# Patient Record
Sex: Female | Born: 1940 | Race: Black or African American | Hispanic: No | Smoking: Former smoker
Health system: Southern US, Community
[De-identification: ages and names within clinical notes are randomized; demographics above are authoritative.]

## PROBLEM LIST (undated history)

## (undated) DIAGNOSIS — C801 Malignant (primary) neoplasm, unspecified: Secondary | ICD-10-CM

## (undated) DIAGNOSIS — M549 Dorsalgia, unspecified: Secondary | ICD-10-CM

## (undated) DIAGNOSIS — I10 Essential (primary) hypertension: Secondary | ICD-10-CM

## (undated) HISTORY — PX: BACK SURGERY: SHX140

## (undated) HISTORY — PX: BREAST SURGERY: SHX581

## (undated) HISTORY — PX: MASTECTOMY: SHX3

---

## 1997-07-19 ENCOUNTER — Ambulatory Visit (HOSPITAL_COMMUNITY): Admission: RE | Admit: 1997-07-19 | Discharge: 1997-07-19 | Payer: Self-pay | Admitting: Family Medicine

## 1997-08-18 ENCOUNTER — Other Ambulatory Visit: Admission: RE | Admit: 1997-08-18 | Discharge: 1997-08-18 | Payer: Self-pay | Admitting: Nephrology

## 1997-08-24 ENCOUNTER — Ambulatory Visit (HOSPITAL_COMMUNITY): Admission: RE | Admit: 1997-08-24 | Discharge: 1997-08-24 | Payer: Self-pay | Admitting: Nephrology

## 1997-09-29 ENCOUNTER — Other Ambulatory Visit: Admission: RE | Admit: 1997-09-29 | Discharge: 1997-09-29 | Payer: Self-pay | Admitting: Nephrology

## 1997-10-20 ENCOUNTER — Ambulatory Visit (HOSPITAL_BASED_OUTPATIENT_CLINIC_OR_DEPARTMENT_OTHER): Admission: RE | Admit: 1997-10-20 | Discharge: 1997-10-20 | Payer: Self-pay | Admitting: Otolaryngology

## 1997-11-14 ENCOUNTER — Ambulatory Visit (HOSPITAL_COMMUNITY): Admission: RE | Admit: 1997-11-14 | Discharge: 1997-11-14 | Payer: Self-pay | Admitting: General Surgery

## 1997-12-07 ENCOUNTER — Ambulatory Visit (HOSPITAL_COMMUNITY): Admission: RE | Admit: 1997-12-07 | Discharge: 1997-12-08 | Payer: Self-pay | Admitting: General Surgery

## 1997-12-07 ENCOUNTER — Encounter (HOSPITAL_BASED_OUTPATIENT_CLINIC_OR_DEPARTMENT_OTHER): Payer: Self-pay | Admitting: General Surgery

## 1998-01-02 ENCOUNTER — Encounter: Admission: RE | Admit: 1998-01-02 | Discharge: 1998-04-02 | Payer: Self-pay | Admitting: Nephrology

## 1998-08-25 ENCOUNTER — Encounter: Admission: RE | Admit: 1998-08-25 | Discharge: 1998-11-23 | Payer: Self-pay | Admitting: Nephrology

## 2016-09-09 ENCOUNTER — Emergency Department: Payer: Medicare Other

## 2016-09-09 ENCOUNTER — Inpatient Hospital Stay
Admission: EM | Admit: 2016-09-09 | Discharge: 2016-09-11 | DRG: 918 | Disposition: A | Discharge status: Home / Self Care | Payer: Medicare Other | Attending: Internal Medicine | Admitting: Internal Medicine

## 2016-09-09 DIAGNOSIS — R4589 Other symptoms and signs involving emotional state: Secondary | ICD-10-CM

## 2016-09-09 DIAGNOSIS — Z87891 Personal history of nicotine dependence: Secondary | ICD-10-CM

## 2016-09-09 DIAGNOSIS — N289 Disorder of kidney and ureter, unspecified: Secondary | ICD-10-CM | POA: Diagnosis present

## 2016-09-09 DIAGNOSIS — T40605A Adverse effect of unspecified narcotics, initial encounter: Secondary | ICD-10-CM

## 2016-09-09 DIAGNOSIS — L8990 Pressure ulcer of unspecified site, unspecified stage: Secondary | ICD-10-CM | POA: Insufficient documentation

## 2016-09-09 DIAGNOSIS — F329 Major depressive disorder, single episode, unspecified: Secondary | ICD-10-CM | POA: Diagnosis not present

## 2016-09-09 DIAGNOSIS — M545 Low back pain: Secondary | ICD-10-CM | POA: Diagnosis not present

## 2016-09-09 DIAGNOSIS — D638 Anemia in other chronic diseases classified elsewhere: Secondary | ICD-10-CM

## 2016-09-09 DIAGNOSIS — N184 Chronic kidney disease, stage 4 (severe): Secondary | ICD-10-CM | POA: Diagnosis not present

## 2016-09-09 DIAGNOSIS — G894 Chronic pain syndrome: Secondary | ICD-10-CM | POA: Diagnosis present

## 2016-09-09 DIAGNOSIS — R531 Weakness: Secondary | ICD-10-CM

## 2016-09-09 DIAGNOSIS — Z853 Personal history of malignant neoplasm of breast: Secondary | ICD-10-CM | POA: Diagnosis not present

## 2016-09-09 DIAGNOSIS — N19 Unspecified kidney failure: Secondary | ICD-10-CM

## 2016-09-09 DIAGNOSIS — D631 Anemia in chronic kidney disease: Secondary | ICD-10-CM | POA: Diagnosis not present

## 2016-09-09 DIAGNOSIS — T40601A Poisoning by unspecified narcotics, accidental (unintentional), initial encounter: Secondary | ICD-10-CM | POA: Diagnosis present

## 2016-09-09 DIAGNOSIS — T402X1A Poisoning by other opioids, accidental (unintentional), initial encounter: Secondary | ICD-10-CM | POA: Diagnosis not present

## 2016-09-09 DIAGNOSIS — R609 Edema, unspecified: Secondary | ICD-10-CM

## 2016-09-09 DIAGNOSIS — R739 Hyperglycemia, unspecified: Secondary | ICD-10-CM | POA: Diagnosis not present

## 2016-09-09 DIAGNOSIS — R0689 Other abnormalities of breathing: Secondary | ICD-10-CM

## 2016-09-09 DIAGNOSIS — I10 Essential (primary) hypertension: Secondary | ICD-10-CM | POA: Diagnosis present

## 2016-09-09 DIAGNOSIS — R4189 Other symptoms and signs involving cognitive functions and awareness: Secondary | ICD-10-CM

## 2016-09-09 DIAGNOSIS — N189 Chronic kidney disease, unspecified: Secondary | ICD-10-CM

## 2016-09-09 HISTORY — DX: Malignant (primary) neoplasm, unspecified: C80.1

## 2016-09-09 HISTORY — DX: Dorsalgia, unspecified: M54.9

## 2016-09-09 HISTORY — DX: Essential (primary) hypertension: I10

## 2016-09-09 LAB — URINE DRUG SCREEN, QUALITATIVE (ARMC ONLY)
AMPHETAMINES, UR SCREEN: NOT DETECTED
BENZODIAZEPINE, UR SCRN: NOT DETECTED
Barbiturates, Ur Screen: NOT DETECTED
CANNABINOID 50 NG, UR ~~LOC~~: NOT DETECTED
Cocaine Metabolite,Ur ~~LOC~~: NOT DETECTED
MDMA (ECSTASY) UR SCREEN: NOT DETECTED
Methadone Scn, Ur: NOT DETECTED
Opiate, Ur Screen: POSITIVE — AB
PHENCYCLIDINE (PCP) UR S: NOT DETECTED
TRICYCLIC, UR SCREEN: NOT DETECTED

## 2016-09-09 LAB — CBC
HEMATOCRIT: 31.5 % — AB (ref 35.0–47.0)
Hemoglobin: 10.6 g/dL — ABNORMAL LOW (ref 12.0–16.0)
MCH: 29.3 pg (ref 26.0–34.0)
MCHC: 33.7 g/dL (ref 32.0–36.0)
MCV: 86.8 fL (ref 80.0–100.0)
PLATELETS: 195 10*3/uL (ref 150–440)
RBC: 3.63 MIL/uL — ABNORMAL LOW (ref 3.80–5.20)
RDW: 14.5 % (ref 11.5–14.5)
WBC: 8.4 10*3/uL (ref 3.6–11.0)

## 2016-09-09 LAB — URINALYSIS, COMPLETE (UACMP) WITH MICROSCOPIC
BACTERIA UA: NONE SEEN
Bilirubin Urine: NEGATIVE
Glucose, UA: NEGATIVE mg/dL
Hgb urine dipstick: NEGATIVE
KETONES UR: NEGATIVE mg/dL
Leukocytes, UA: NEGATIVE
Nitrite: NEGATIVE
PROTEIN: NEGATIVE mg/dL
RBC / HPF: NONE SEEN RBC/hpf (ref 0–5)
Specific Gravity, Urine: 1.008 (ref 1.005–1.030)
pH: 5 (ref 5.0–8.0)

## 2016-09-09 LAB — COMPREHENSIVE METABOLIC PANEL
ALT: 14 U/L (ref 14–54)
AST: 39 U/L (ref 15–41)
Albumin: 4.2 g/dL (ref 3.5–5.0)
Alkaline Phosphatase: 78 U/L (ref 38–126)
Anion gap: 11 (ref 5–15)
BILIRUBIN TOTAL: 0.6 mg/dL (ref 0.3–1.2)
BUN: 44 mg/dL — AB (ref 6–20)
CHLORIDE: 106 mmol/L (ref 101–111)
CO2: 22 mmol/L (ref 22–32)
CREATININE: 2.62 mg/dL — AB (ref 0.44–1.00)
Calcium: 9.2 mg/dL (ref 8.9–10.3)
GFR, EST AFRICAN AMERICAN: 19 mL/min — AB (ref >60)
GFR, EST NON AFRICAN AMERICAN: 17 mL/min — AB (ref >60)
Glucose, Bld: 106 mg/dL — ABNORMAL HIGH (ref 65–99)
POTASSIUM: 4.4 mmol/L (ref 3.5–5.1)
Sodium: 139 mmol/L (ref 135–145)
TOTAL PROTEIN: 7.8 g/dL (ref 6.5–8.1)

## 2016-09-09 LAB — BRAIN NATRIURETIC PEPTIDE: B NATRIURETIC PEPTIDE 5: 138 pg/mL — AB (ref 0.0–100.0)

## 2016-09-09 LAB — ETHANOL

## 2016-09-09 LAB — AMMONIA: AMMONIA: 30 umol/L (ref 9–35)

## 2016-09-09 LAB — TROPONIN I: Troponin I: <0.03 ng/mL (ref <0.03)

## 2016-09-09 MED ORDER — ONDANSETRON HCL 4 MG/2ML IJ SOLN
4.0000 mg | Freq: Once | INTRAMUSCULAR | Status: AC
  Administered 2016-09-09: 4 mg via INTRAVENOUS

## 2016-09-09 MED ORDER — NALOXONE HCL 2 MG/2ML IJ SOSY
0.4000 mg | PREFILLED_SYRINGE | Freq: Once | INTRAMUSCULAR | Status: AC
  Administered 2016-09-09: 0.4 mg via INTRAVENOUS

## 2016-09-09 MED ORDER — IOPAMIDOL (ISOVUE-370) INJECTION 76%
75.0000 mL | Freq: Once | INTRAVENOUS | Status: AC | PRN
  Administered 2016-09-09: 75 mL via INTRAVENOUS

## 2016-09-09 MED ORDER — SODIUM CHLORIDE 0.9 % IV BOLUS (SEPSIS)
500.0000 mL | Freq: Once | INTRAVENOUS | Status: AC
  Administered 2016-09-09: 500 mL via INTRAVENOUS

## 2016-09-09 NOTE — ED Notes (Signed)
Pt incontinent of bowel and bladder. Pts sheets and gown changed. Pts skin cleaned.  Pt moved up in bed

## 2016-09-09 NOTE — H&P (Signed)
History and Physical   [facility] - [place] @ [facility] Admission History and Physical [employer], D.O.    Patient Name: Lydia Barton MR#: [id number] Date of Birth: 06-13-1940 Date of Admission: 09/09/2016  Referring MD/NP/PA: Dr. Mariea Clonts Primary Care Physician: System, Pcp Not In Patient coming from: Home  Chief Complaint:  Chief Complaint  Patient presents with  . Loss of Consciousness  Please note the entire history is obtained from the patient's emergency department chart, emergency department provider and the patient's family who is at the bedside. Patient's personal history is limited by altered mental status.   HPI: Lydia Barton is a 76 y.o. female with a known history of depression, Hypertension, chronic low back pain on narcotics presents to the emergency department for evaluation of LOC.  Patient was in a usual state of health until This afternoon. Patient was waiting in the car while her family went in to [place]. When they returned after 1 hour she was found locked in the car and was unresponsive.EMS provided with 1 mg of Narcan with some improvement in her responsiveness however she then became unresponsive again and a second dose of Narcan did not help. On arrival to the emergency department she had significant respiratory depression and pinpoint pupils. She received another 4 mg of marked Narcan which again helped her symptoms.  Patient is arousable and is able to answer questions and follow commands however she remains somnolent. she states that she did not take any extra pain medication today. Denies any intent to hurt herself.    Of note patient is in town for her mother's funeral. Otherwise there has been no change in status. Patient has been taking medication as prescribed and there has been no recent change in medication or diet.  No recent antibiotics.  There has been no recent illness, hospitalizations, travel or sick contacts.    EMS/ED Course: Patient  received Narcan 0.4mg , Zofran, NS. Medical admission was requested for further management of possible unintentional overdose of opiates.  Review of Systems:  Unable to obtain a reliable review of systems secondary to altered mental status.  Past Medical History:  Diagnosis Date  . Back pain   . Cancer ([place])   . Hypertension     Past Surgical History:  Procedure Laterality Date  . BACK SURGERY    . BREAST SURGERY    . MASTECTOMY     left     reports that she has quit smoking. She has never used smokeless tobacco. Her alcohol and drug histories are not on file.  Allergies  Allergen Reactions  . Latex Hives    No family history on file.  Prior to Admission medications   Not on File    Physical Exam: Vitals:   09/09/16 2110 09/09/16 2114  BP: 134/72   Pulse: 76   Resp: (!) 26   Temp: 98.8 F (37.1 C)   TempSrc: Axillary   SpO2: 98%   Weight:  (!) 147.4 kg (325 lb)    GENERAL: 76 y.o.-year-old female patient, well-developed, well-nourished lying in the bed in no acute distress.  Somnolent but responsive to tactile and painful stimuli. She does open her eyes, answers questions and follows commands. HEENT: Head atraumatic, normocephalic. Pupils equal, round, reactive to light and accommodation. No scleral icterus. Extraocular muscles intact. Nares are patent. Oropharynx is clear. Mucus membranes dry. NECK: Supple, full range of motion. No JVD, no bruit heard. No thyroid enlargement, no tenderness, no cervical lymphadenopathy. CHEST: Normal breath sounds  bilaterally. No wheezing, rales, rhonchi or crackles. No use of accessory muscles of respiration.  No reproducible chest wall tenderness.  CARDIOVASCULAR: S1, S2 normal. No murmurs, rubs, or gallops. Cap refill <2 seconds. Pulses intact distally.  ABDOMEN: Soft, nondistended, nontender. No rebound, guarding, rigidity. Normoactive bowel sounds present in all four quadrants. No organomegaly or mass. EXTREMITIES: Moves all of  these. Positive pitting edema bilaterally to the knees. NO  cyanosis, or clubbing. No calf tenderness or Homan's sign.  NEUROLOGIC: The patient is arousable and is oriented 1.. She does follow commands but cannot comply with full neurologic exam. SKIN: Warm, dry, and intact without obvious rash, lesion, or ulcer.    Labs on Admission:  CBC:  Recent Labs Lab 09/09/16 2112  WBC 8.4  HGB 10.6*  HCT 31.5*  MCV 86.8  PLT 176   Basic Metabolic Panel:  Recent Labs Lab 09/09/16 2112  NA 139  K 4.4  CL 106  CO2 22  GLUCOSE 106*  BUN 44*  CREATININE 2.62*  CALCIUM 9.2   GFR: CrCl cannot be calculated (Unknown ideal weight.). Liver Function Tests:  Recent Labs Lab 09/09/16 2112  AST 39  ALT 14  ALKPHOS 78  BILITOT 0.6  PROT 7.8  ALBUMIN 4.2   No results for input(s): LIPASE, AMYLASE in the last 168 hours. No results for input(s): AMMONIA in the last 168 hours. Coagulation Profile: No results for input(s): INR, PROTIME in the last 168 hours. Cardiac Enzymes:  Recent Labs Lab 09/09/16 2112  TROPONINI <0.03   BNP (last 3 results) No results for input(s): PROBNP in the last 8760 hours. HbA1C: No results for input(s): HGBA1C in the last 72 hours. CBG: No results for input(s): GLUCAP in the last 168 hours. Lipid Profile: No results for input(s): CHOL, HDL, LDLCALC, TRIG, CHOLHDL, LDLDIRECT in the last 72 hours. Thyroid Function Tests: No results for input(s): TSH, T4TOTAL, FREET4, T3FREE, THYROIDAB in the last 72 hours. Anemia Panel: No results for input(s): VITAMINB12, FOLATE, FERRITIN, TIBC, IRON, RETICCTPCT in the last 72 hours. Urine analysis:    Component Value Date/Time   COLORURINE YELLOW (A) 09/09/2016 2114   APPEARANCEUR HAZY (A) 09/09/2016 2114   LABSPEC 1.008 09/09/2016 2114   PHURINE 5.0 09/09/2016 2114   GLUCOSEU NEGATIVE 09/09/2016 2114   HGBUR NEGATIVE 09/09/2016 2114   BILIRUBINUR NEGATIVE 09/09/2016 2114   [place] NEGATIVE 09/09/2016  2114   PROTEINUR NEGATIVE 09/09/2016 2114   NITRITE NEGATIVE 09/09/2016 2114   LEUKOCYTESUR NEGATIVE 09/09/2016 2114   Sepsis Labs: @LABRCNTIP (procalcitonin:4,lacticidven:4) )No results found for this or any previous visit (from the past 240 hour(s)).   Radiological Exams on Admission: Ct Head Wo Contrast  Result Date: 09/09/2016 CLINICAL DATA:  Unresponsive, agonal breathing EXAM: CT HEAD WITHOUT CONTRAST TECHNIQUE: Contiguous axial images were obtained from the base of the skull through the vertex without intravenous contrast. COMPARISON:  None. FINDINGS: Brain: No acute territorial infarction, hemorrhage or intracranial mass is seen. There is mild atrophy. The ventricles are nonenlarged. Empty sella with prominent CSF space noted. Vascular: No hyperdense vessels.  Carotid artery calcifications. Skull: No fracture or suspicious bone lesion Sinuses/Orbits: Mucosal thickening in the maxillary and ethmoid sinuses. No acute orbital abnormality. Other: None IMPRESSION: No CT evidence for acute intracranial abnormality. Electronically Signed   By: Donavan Foil M.D.   On: 09/09/2016 22:16   Ct Angio Chest Pe W And/or Wo Contrast  Result Date: 09/09/2016 CLINICAL DATA:  76 year old female with shortness of breath and altered mental status. EXAM:  CT ANGIOGRAPHY CHEST WITH CONTRAST TECHNIQUE: Multidetector CT imaging of the chest was performed using the standard protocol during bolus administration of intravenous contrast. Multiplanar CT image reconstructions and MIPs were obtained to evaluate the vascular anatomy. CONTRAST:  75 cc intravenous Isovue 370 COMPARISON:  None. FINDINGS: Cardiovascular: This is a technically adequate study but respiratory motion artifact decreases sensitivity within portions of the lungs. No pulmonary emboli are identified. Cardiomegaly and coronary artery calcifications noted. There is no evidence of thoracic aortic aneurysm or pericardial effusion. Mediastinum/Nodes: No  enlarged mediastinal, hilar, or axillary lymph nodes. Thyroid gland, trachea, and esophagus demonstrate no significant findings. Lungs/Pleura: Minimal dependent atelectasis noted. There is no evidence of airspace disease, consolidation, mass, pleural effusion or pneumothorax. Upper Abdomen: Upper right kidney hydronephrosis versus parapelvic cysts noted. Musculoskeletal: No acute abnormality or suspicious bony lesions. Review of the MIP images confirms the above findings. IMPRESSION: No evidence of pulmonary emboli. Cardiomegaly and coronary artery disease. Upper right kidney hydronephrosis versus parapelvic cysts. Minimal dependent atelectasis. Electronically Signed   By: Margarette Canada M.D.   On: 09/09/2016 22:21   Dg Chest Portable 1 View  Result Date: 09/09/2016 CLINICAL DATA:  Found unresponsive in parking lot EXAM: PORTABLE CHEST 1 VIEW COMPARISON:  CT 09/09/2016 FINDINGS: Cardiomegaly. Generous mediastinum. Mild central congestion. No focal consolidation or effusion. No pneumothorax. Surgical clips at the left thoracic inlet. Left axillary surgical clips. IMPRESSION: Cardiomegaly with central vascular congestion. No focal infiltrate is seen. Electronically Signed   By: Donavan Foil M.D.   On: 09/09/2016 22:33    EKG: Normal sinus rhythm at 90 bpm with normal axis and nonspecific ST-T wave changes.   Assessment/Plan  This is a 76 y.o. female with a history of depression, hypertension, chronic pain on narcotics now being admitted with:  #. Unintentional narcotics overdose stable at present however she is still somnolent. - Admit inpatient, tele, continuous pulse ox - Additional narcan if needed - Supportive oxygen.  #. Renal insufficiency, unclear acute or chronic - IVFs and recheck BMP in AM  #. We'll need to reconcile home medications with pharmacy.  Admission status: Inpatient, tele IV Fluids: NS Diet/Nutrition: NPO until more awake Consults called: None  DVT Px: Lovenox, SCDs and  early ambulation. Code Status: Full Code  Disposition Plan: To home in 1-2 days  All the records are reviewed and case discussed with ED provider. Management plans discussed with the patient and/or family who express understanding and agree with plan of care.  Dallis Darden D.O. on 09/09/2016 at 11:15 PM Between 7am to 6pm - Pager - [phone] After 6pm go to [email or web address] - password [email or web address] [facility] [place] [facility] Office [phone] CC: Primary care physician; System, Pcp Not In   09/09/2016, 11:15 PM

## 2016-09-09 NOTE — ED Notes (Signed)
MD Mariea Clonts at bedside. Pt bib EMS, found unresponsive in car by daughter in [place] parking lot.  Daughter sts that she went into [employer], left pt sitting in car and when she return pt had locked doors and unconscious. Per EMS fire department reported resp 2 per min on scene.

## 2016-09-09 NOTE — ED Notes (Signed)
Patient transported to CT 

## 2016-09-09 NOTE — ED Provider Notes (Addendum)
[facility] Emergency Department Provider Note  ____________________________________________  Time seen: Approximately 9:14 PM  I have reviewed the triage vital signs and the nursing notes.   HISTORY  Chief Complaint Loss of Consciousness  The history is limited due to the patient's unresponsiveness.  HPI Lydia Barton is a 76 y.o. female with a history of remote breast cancer status post mastectomy, chronic pain on opioids, brought by EMS for unresponsiveness. The patient was traveling with her daughterby car from [place] when her daughter went inside to get the patient a motorized cart since she is not ambulatory. Upon arriving back at the car, her daughter noted the patient to be completely unresponsive. EMS noted a normal blood sugar of 131, but the patient was breathing twice per minute. BVM was applied and the patient was given 1 mg of Narcan with some improvement in her breathing. She then had decreased responsiveness and a second milligram of Narcan did not help. She was responsive to painful stimulus.    On arrival to the emergency department, the patient was exhibiting respiratory depression and pinpoint pupils, but was responsive to painful stimulus and some commands, moving all 4 extremities.  Past Medical History:  Diagnosis Date  . Back pain   . Cancer ([place])   . Hypertension     There are no active problems to display for this patient.   Past Surgical History:  Procedure Laterality Date  . BACK SURGERY    . BREAST SURGERY    . MASTECTOMY     left      Allergies Latex  No family history on file.  Social History Social History  Substance Use Topics  . Smoking status: Former Research scientist (life sciences)  . Smokeless tobacco: Never Used  . Alcohol use Not on file    Review of Systems  Limited due to patient's unresponsiveness. Constitutional:Unresponsive. Eyes: Pinpoint pupils. ENT: Unable to obtain Cardiovascular: Unable to obtain Respiratory:  Agonal breaths. Gastrointestinal:  No nausea, no vomiting.   Genitourinary: Unable to obtain Musculoskeletal: Chronic bilateral knee pain. Neurological: Unresponsive. But moving all 4 extremity's. Psych: Daughter denies any prior suicidality or suicide attempts.    ____________________________________________   PHYSICAL EXAM:  VITAL SIGNS: ED Triage Vitals [09/09/16 2109]  Enc Vitals Group     BP      Pulse      Resp      Temp      Temp src      SpO2      Weight      Height      Head Circumference      Peak Flow      Pain Score 0     Pain Loc      Pain Edu?      Excl. in [place]?     Constitutional: The patient is generally unresponsive, but does mumble to loud voice. She does withdraw to pain. She moves all 4 extremities. Eyes: Conjunctivae are normal.  EOMI. pupils are 1-2 mm and symmetric as well as reactive bilaterally. No scleral icterus. Head: Atraumatic. No raccoon eyes or Battle sign. Nose: No congestion/rhinnorhea. No swelling over the nose or septal hematoma. Mouth/Throat: Mucous membranes are dry.  Neck: No stridor.  Supple.  No JVD. No meningismus. Cardiovascular: Normal rate, regular rhythm. No murmurs, rubs or gallops. Left chest status post mastectomy. Respiratory: Normal respiratory effort.  No accessory muscle use or retractions. Lungs CTAB.  No wheezes, rales or ronchi. Gastrointestinal: Morbidly obese. Soft, nontender and  nondistended.  No guarding or rebound.  No peritoneal signs. Musculoskeletal: Bilateral significant pitting edema to the mid thigh. Minimal erythema over the legs without overlying warmth or skin breakdown. No purulent discharge. Neurologic:  somnolent but answers some questions to loud voice. The patient does not spontaneously open her eyes. When I open her eyelids, pupils are pinpoint but symmetric and reactive bilaterally. EOMI. Face is symmetric. 5 out of 5 strength bilaterally. The patient is able to move her feet bilaterally.  Skin:   Skin is warm, dry and intact.  Psychiatric: Unable to assess.  ____________________________________________   LABS (all labs ordered are listed, but only abnormal results are displayed)  Labs Reviewed  CBC - Abnormal; Notable for the following:       Result Value   RBC 3.63 (*)    Hemoglobin 10.6 (*)    HCT 31.5 (*)    All other components within normal limits  COMPREHENSIVE METABOLIC PANEL - Abnormal; Notable for the following:    Glucose, Bld 106 (*)    BUN 44 (*)    Creatinine, Ser 2.62 (*)    GFR calc non Af Amer 17 (*)    GFR calc Af Amer 19 (*)    All other components within normal limits  URINE DRUG SCREEN, QUALITATIVE ([facility] ONLY) - Abnormal; Notable for the following:    Opiate, Ur Screen POSITIVE (*)    All other components within normal limits  BRAIN NATRIURETIC PEPTIDE - Abnormal; Notable for the following:    B Natriuretic Peptide 138.0 (*)    All other components within normal limits  URINALYSIS, COMPLETE (UACMP) WITH MICROSCOPIC - Abnormal; Notable for the following:    Color, Urine YELLOW (*)    APPearance HAZY (*)    Squamous Epithelial / LPF 0-5 (*)    All other components within normal limits  CULTURE, BLOOD (ROUTINE X 2)  CULTURE, BLOOD (ROUTINE X 2)  ETHANOL  TROPONIN I  AMMONIA   ____________________________________________  EKG  ED ECG REPORT I, Eula Listen, the attending physician, personally viewed and interpreted this ECG.   Date: 09/09/2016  EKG Time: 2109  Rate: 90  Rhythm: normal sinus rhythm  Axis: leftward  Intervals:none  ST&T Change: NO STEMI  ____________________________________________  RADIOLOGY  Ct Head Wo Contrast  Result Date: 09/09/2016 CLINICAL DATA:  Unresponsive, agonal breathing EXAM: CT HEAD WITHOUT CONTRAST TECHNIQUE: Contiguous axial images were obtained from the base of the skull through the vertex without intravenous contrast. COMPARISON:  None. FINDINGS: Brain: No acute territorial infarction,  hemorrhage or intracranial mass is seen. There is mild atrophy. The ventricles are nonenlarged. Empty sella with prominent CSF space noted. Vascular: No hyperdense vessels.  Carotid artery calcifications. Skull: No fracture or suspicious bone lesion Sinuses/Orbits: Mucosal thickening in the maxillary and ethmoid sinuses. No acute orbital abnormality. Other: None IMPRESSION: No CT evidence for acute intracranial abnormality. Electronically Signed   By: Donavan Foil M.D.   On: 09/09/2016 22:16   Ct Angio Chest Pe W And/or Wo Contrast  Result Date: 09/09/2016 CLINICAL DATA:  76 year old female with shortness of breath and altered mental status. EXAM: CT ANGIOGRAPHY CHEST WITH CONTRAST TECHNIQUE: Multidetector CT imaging of the chest was performed using the standard protocol during bolus administration of intravenous contrast. Multiplanar CT image reconstructions and MIPs were obtained to evaluate the vascular anatomy. CONTRAST:  75 cc intravenous Isovue 370 COMPARISON:  None. FINDINGS: Cardiovascular: This is a technically adequate study but respiratory motion artifact decreases sensitivity within portions of  the lungs. No pulmonary emboli are identified. Cardiomegaly and coronary artery calcifications noted. There is no evidence of thoracic aortic aneurysm or pericardial effusion. Mediastinum/Nodes: No enlarged mediastinal, hilar, or axillary lymph nodes. Thyroid gland, trachea, and esophagus demonstrate no significant findings. Lungs/Pleura: Minimal dependent atelectasis noted. There is no evidence of airspace disease, consolidation, mass, pleural effusion or pneumothorax. Upper Abdomen: Upper right kidney hydronephrosis versus parapelvic cysts noted. Musculoskeletal: No acute abnormality or suspicious bony lesions. Review of the MIP images confirms the above findings. IMPRESSION: No evidence of pulmonary emboli. Cardiomegaly and coronary artery disease. Upper right kidney hydronephrosis versus parapelvic cysts.  Minimal dependent atelectasis. Electronically Signed   By: Margarette Canada M.D.   On: 09/09/2016 22:21   Dg Chest Portable 1 View  Result Date: 09/09/2016 CLINICAL DATA:  Found unresponsive in parking lot EXAM: PORTABLE CHEST 1 VIEW COMPARISON:  CT 09/09/2016 FINDINGS: Cardiomegaly. Generous mediastinum. Mild central congestion. No focal consolidation or effusion. No pneumothorax. Surgical clips at the left thoracic inlet. Left axillary surgical clips. IMPRESSION: Cardiomegaly with central vascular congestion. No focal infiltrate is seen. Electronically Signed   By: Donavan Foil M.D.   On: 09/09/2016 22:33    ____________________________________________   PROCEDURES  Procedure(s) performed: None  Procedures  Critical Care performed: Yes, see critical care note(s) ____________________________________________   INITIAL IMPRESSION / ASSESSMENT AND PLAN / ED COURSE  Pertinent labs & imaging results that were available during my care of the patient were reviewed by me and considered in my medical decision making (see chart for details).  76 y.o. female with known chronic pain as well as multiple other comorbidities presenting with decreased responsiveness that was initially responsive to Narcan. Overall, the patient's clinical history as well as response Narcan is most indicative of a narcotic overdose, likely unintentional as the patient's daughter said states that she has no history of suicidality. However, we will evaluate for other etiologies including acute CVA, ACS or MI, urinary tract infection, pulmonary infection, or severe electrolyte disturbance. I will also get a CT angiographic to evaluate for PE, given her cancer history, lower extremity edema, and recent car ride. The patient will require admission. I will treat her with an additional 4 mg of Narcan.  When she is more awake, the patient will need the primary physician to talk to her more about any psychiatric or intentional causes  of overdose. The patient is currently traveling for the funeral of her mother, so she is at risk for depression although this does not seem to be the primary cause at this time.   ----------------------------------------- 10:42 PM on 09/09/2016 -----------------------------------------  The patient did have an initial response to Narcan where she became more alert and at this time, does answer basic questions, but is still intermittently somnolent. Her CT head does not show any acute pathology. Her CT angiogram does not show PE. She does have some renal insufficiency, as well as anemia, and I do not know if these are stable or chronic is a do not have previous laboratory studies for this patient. At this time, the patient has stabilized, but will require admission as she is not back to normal mental status.   CRITICAL CARE Performed by: Eula Listen   Total critical care time: 50 minutes  Critical care time was exclusive of separately billable procedures and treating other patients.  Critical care was necessary to treat or prevent imminent or life-threatening deterioration.  Critical care was time spent personally by me on the following activities: development  of treatment plan with patient and/or surrogate as well as nursing, discussions with consultants, evaluation of patient's response to treatment, examination of patient, obtaining history from patient or surrogate, ordering and performing treatments and interventions, ordering and review of laboratory studies, ordering and review of radiographic studies, pulse oximetry and re-evaluation of patient's condition.   ____________________________________________  FINAL CLINICAL IMPRESSION(S) / ED DIAGNOSES  Final diagnoses:  Opioid overdose, accidental or unintentional, initial encounter  Narcotic-induced respiratory depression  Unresponsiveness  Renal insufficiency         NEW MEDICATIONS STARTED DURING THIS  VISIT:  New Prescriptions   No medications on file      Eula Listen, MD 09/09/16 2244    Eula Listen, MD 09/09/16 2245

## 2016-09-10 ENCOUNTER — Encounter: Payer: Self-pay | Admitting: Psychiatry

## 2016-09-10 ENCOUNTER — Inpatient Hospital Stay: Payer: Medicare Other

## 2016-09-10 DIAGNOSIS — T402X1A Poisoning by other opioids, accidental (unintentional), initial encounter: Secondary | ICD-10-CM | POA: Diagnosis not present

## 2016-09-10 DIAGNOSIS — L8990 Pressure ulcer of unspecified site, unspecified stage: Secondary | ICD-10-CM | POA: Insufficient documentation

## 2016-09-10 DIAGNOSIS — T40601A Poisoning by unspecified narcotics, accidental (unintentional), initial encounter: Secondary | ICD-10-CM | POA: Diagnosis not present

## 2016-09-10 DIAGNOSIS — N289 Disorder of kidney and ureter, unspecified: Secondary | ICD-10-CM | POA: Diagnosis not present

## 2016-09-10 LAB — COMPREHENSIVE METABOLIC PANEL
ALT: 15 U/L (ref 14–54)
AST: 37 U/L (ref 15–41)
Albumin: 3.9 g/dL (ref 3.5–5.0)
Alkaline Phosphatase: 73 U/L (ref 38–126)
Anion gap: 10 (ref 5–15)
BILIRUBIN TOTAL: 0.8 mg/dL (ref 0.3–1.2)
BUN: 42 mg/dL — AB (ref 6–20)
CHLORIDE: 105 mmol/L (ref 101–111)
CO2: 26 mmol/L (ref 22–32)
CREATININE: 2.44 mg/dL — AB (ref 0.44–1.00)
Calcium: 9.1 mg/dL (ref 8.9–10.3)
GFR calc Af Amer: 21 mL/min — ABNORMAL LOW (ref >60)
GFR, EST NON AFRICAN AMERICAN: 18 mL/min — AB (ref >60)
Glucose, Bld: 113 mg/dL — ABNORMAL HIGH (ref 65–99)
Potassium: 4.2 mmol/L (ref 3.5–5.1)
Sodium: 141 mmol/L (ref 135–145)
Total Protein: 7.6 g/dL (ref 6.5–8.1)

## 2016-09-10 LAB — CBC
HEMATOCRIT: 32.8 % — AB (ref 35.0–47.0)
HEMOGLOBIN: 10.8 g/dL — AB (ref 12.0–16.0)
MCH: 29.1 pg (ref 26.0–34.0)
MCHC: 33 g/dL (ref 32.0–36.0)
MCV: 88.3 fL (ref 80.0–100.0)
Platelets: 222 10*3/uL (ref 150–440)
RBC: 3.71 MIL/uL — ABNORMAL LOW (ref 3.80–5.20)
RDW: 14.3 % (ref 11.5–14.5)
WBC: 9.3 10*3/uL (ref 3.6–11.0)

## 2016-09-10 LAB — MAGNESIUM: MAGNESIUM: 1.9 mg/dL (ref 1.7–2.4)

## 2016-09-10 LAB — TROPONIN I
Troponin I: <0.03 ng/mL (ref <0.03)
Troponin I: <0.03 ng/mL (ref <0.03)
Troponin I: <0.03 ng/mL (ref <0.03)

## 2016-09-10 LAB — PHOSPHORUS: PHOSPHORUS: 4.5 mg/dL (ref 2.5–4.6)

## 2016-09-10 LAB — TSH: TSH: 0.415 u[IU]/mL (ref 0.350–4.500)

## 2016-09-10 MED ORDER — SODIUM CHLORIDE 0.9 % IV SOLN
INTRAVENOUS | Status: DC
  Administered 2016-09-10: 13:00:00 via INTRAVENOUS

## 2016-09-10 MED ORDER — ALBUTEROL SULFATE (2.5 MG/3ML) 0.083% IN NEBU: 2.5000 mg | INHALATION_SOLUTION | Freq: Four times a day (QID) | RESPIRATORY_TRACT | Status: DC | PRN

## 2016-09-10 MED ORDER — ACETAMINOPHEN 650 MG RE SUPP: 650.0000 mg | Freq: Four times a day (QID) | RECTAL | Status: DC | PRN

## 2016-09-10 MED ORDER — SODIUM CHLORIDE 0.9 % IV SOLN
INTRAVENOUS | Status: DC
  Administered 2016-09-10: 03:00:00 via INTRAVENOUS

## 2016-09-10 MED ORDER — MORPHINE SULFATE ER 30 MG PO TBCR
30.0000 mg | EXTENDED_RELEASE_TABLET | Freq: Two times a day (BID) | ORAL | Status: DC
  Administered 2016-09-10 – 2016-09-11 (×2): 30 mg via ORAL
  Filled 2016-09-10 (×2): qty 1

## 2016-09-10 MED ORDER — MORPHINE SULFATE 15 MG PO TABS
30.0000 mg | ORAL_TABLET | Freq: Two times a day (BID) | ORAL | Status: DC
  Administered 2016-09-10: 30 mg via ORAL
  Filled 2016-09-10: qty 2

## 2016-09-10 MED ORDER — BISACODYL 5 MG PO TBEC: 5.0000 mg | DELAYED_RELEASE_TABLET | Freq: Every day | ORAL | Status: DC | PRN

## 2016-09-10 MED ORDER — ACETAMINOPHEN 325 MG PO TABS: 650.0000 mg | ORAL_TABLET | Freq: Four times a day (QID) | ORAL | Status: DC | PRN

## 2016-09-10 MED ORDER — SENNOSIDES-DOCUSATE SODIUM 8.6-50 MG PO TABS: 1.0000 | ORAL_TABLET | Freq: Every evening | ORAL | Status: DC | PRN

## 2016-09-10 MED ORDER — MIRTAZAPINE 15 MG PO TBDP: 15.0000 mg | ORAL_TABLET | Freq: Every day | ORAL | Status: DC

## 2016-09-10 MED ORDER — MAGNESIUM CITRATE PO SOLN
1.0000 | Freq: Once | ORAL | Status: DC | PRN
  Filled 2016-09-10: qty 296

## 2016-09-10 MED ORDER — DULOXETINE HCL 30 MG PO CPEP
30.0000 mg | ORAL_CAPSULE | Freq: Every day | ORAL | Status: DC
  Administered 2016-09-10 – 2016-09-11 (×2): 30 mg via ORAL
  Filled 2016-09-10 (×2): qty 1

## 2016-09-10 MED ORDER — MISOPROSTOL 200 MCG PO TABS
200.0000 ug | ORAL_TABLET | Freq: Two times a day (BID) | ORAL | Status: DC
  Administered 2016-09-10 – 2016-09-11 (×3): 200 ug via ORAL
  Filled 2016-09-10 (×4): qty 1

## 2016-09-10 MED ORDER — MONTELUKAST SODIUM 10 MG PO TABS
10.0000 mg | ORAL_TABLET | Freq: Every day | ORAL | Status: DC
  Administered 2016-09-10: 10 mg via ORAL
  Filled 2016-09-10: qty 1

## 2016-09-10 MED ORDER — ENOXAPARIN SODIUM 40 MG/0.4ML ~~LOC~~ SOLN
40.0000 mg | SUBCUTANEOUS | Status: DC
  Administered 2016-09-10 – 2016-09-11 (×2): 40 mg via SUBCUTANEOUS
  Filled 2016-09-10 (×3): qty 0.4

## 2016-09-10 MED ORDER — IPRATROPIUM BROMIDE 0.02 % IN SOLN: 0.5000 mg | Freq: Four times a day (QID) | RESPIRATORY_TRACT | Status: DC | PRN

## 2016-09-10 MED ORDER — LINACLOTIDE 145 MCG PO CAPS
145.0000 ug | ORAL_CAPSULE | Freq: Every day | ORAL | Status: DC
  Administered 2016-09-11: 145 ug via ORAL
  Filled 2016-09-10: qty 1

## 2016-09-10 MED ORDER — MIRTAZAPINE 15 MG PO TBDP
7.5000 mg | ORAL_TABLET | Freq: Every day | ORAL | Status: DC
  Administered 2016-09-10: 7.5 mg via ORAL
  Filled 2016-09-10 (×2): qty 0.5

## 2016-09-10 MED ORDER — ONDANSETRON HCL 4 MG/2ML IJ SOLN: 4.0000 mg | Freq: Four times a day (QID) | INTRAMUSCULAR | Status: DC | PRN

## 2016-09-10 MED ORDER — ONDANSETRON HCL 4 MG PO TABS: 4.0000 mg | ORAL_TABLET | Freq: Four times a day (QID) | ORAL | Status: DC | PRN

## 2016-09-10 NOTE — Plan of Care (Signed)
Problem: Education: Goal: Knowledge of [facility] Education information/materials will improve Outcome: Completed/Met Date Met: 09/10/16 Pt admitted from ED with possible unintentionally overdose. Pt A&O x 4 now. Tele box 21, verified by alisa scott, skin with 2 pressure ulcers on buttock and back of leg and also an abrasion on right knee, verified by Sabra Heck, RN. Pt incontinent. Normal saline at 10m/hr.  Problem: Tissue Perfusion: Goal: Risk factors for ineffective tissue perfusion will decrease Outcome: Completed/Met Date Met: 09/10/16 lovenox and SCD's for VTE

## 2016-09-10 NOTE — Consult Note (Signed)
[facility] Consult   Reason for Consult: accidental OD Referring Physician:  IM Patient Identification: Lydia Barton MRN:  [id number] Principal Diagnosis: Opiate overdose Diagnosis:   Patient Active Problem List   Diagnosis Date Noted  . Pressure injury of skin [L89.90] 09/10/2016  . Opiate overdose [T40.601A] 09/09/2016    Total Time spent with patient: 1 hour  Subjective:   Lydia Barton is a 76 y.o. female patient admitted with accidental OD  HPI:    Patient is a 76 year old African-American female from [place] who has history of chronic back pain. The patient came to [facility] in the company of her daughter for her mother's funeral.  While patient was at the [place] parking lot waiting for her daughter she took her pain medication, which she has been taking for years, along with 2 muscle relaxants  that were just recently prescribed to her.  Patient was later on found by her daughter unresponsive in the car.  Patient states this was not a suicidal attempt. She denies having any prior psychiatric history. She denies any prior history of suicidal attempts or substance abuse.  Currently she denies any symptoms consistent with major depressive disorder, bipolar disorder, she denies suicidality, homicidality. Denies auditory or visual hallucinations.  Denies any history of substance abuse  Denies any history of trauma  I attempted to contact the daughter for collateral information but appears that we have the incorrect contact information.  Family will need to be contacted in order to corroborate patient's story.  Past Psychiatric History: Denies  Risk to Self: Is patient at risk for suicide?: No Risk to Others:  no  Past Medical History:  Past Medical History:  Diagnosis Date  . Back pain   . Cancer ([place])   . Hypertension     Past Surgical History:  Procedure Laterality Date  . BACK SURGERY    . BREAST SURGERY    . MASTECTOMY     left   Family  History: No family history on file.   Family Psychiatric  History: Patient has 2 brothers who had some issues with drug addiction  Social History: Patient lives in [place] with her younger daughter a her older daughter's son. Her older daughter was just released from prison after a 15 year incarceration. Her mother just passed away about a week ago here in [place].  Patient apparently recently had file for bankruptcy. History  Alcohol use Not on file     History  Drug use: Unknown    Social History   Social History  . Marital status: Divorced    Spouse name: N/A  . Number of children: N/A  . Years of education: N/A   Social History Main Topics  . Smoking status: Former Research scientist (life sciences)  . Smokeless tobacco: Never Used  . Alcohol use None  . Drug use: Unknown  . Sexual activity: Not Asked   Other Topics Concern  . None   Social History Narrative  . None   Additional Social History:    Allergies:   Allergies  Allergen Reactions  . Latex Hives    Labs:  Results for orders placed or performed during the hospital encounter of 09/09/16 (from the past 48 hour(s))  CBC     Status: Abnormal   Collection Time: 09/09/16  9:12 PM  Result Value Ref Range   WBC 8.4 3.6 - 11.0 K/uL   RBC 3.63 (L) 3.80 - 5.20 MIL/uL   Hemoglobin 10.6 (L) 12.0 - 16.0 g/dL  HCT 31.5 (L) 35.0 - 47.0 %   MCV 86.8 80.0 - 100.0 fL   MCH 29.3 26.0 - 34.0 pg   MCHC 33.7 32.0 - 36.0 g/dL   RDW 14.5 11.5 - 14.5 %   Platelets 195 150 - 440 K/uL  Comprehensive metabolic panel     Status: Abnormal   Collection Time: 09/09/16  9:12 PM  Result Value Ref Range   Sodium 139 135 - 145 mmol/L   Potassium 4.4 3.5 - 5.1 mmol/L   Chloride 106 101 - 111 mmol/L   CO2 22 22 - 32 mmol/L   Glucose, Bld 106 (H) 65 - 99 mg/dL   BUN 44 (H) 6 - 20 mg/dL   Creatinine, Ser 2.62 (H) 0.44 - 1.00 mg/dL   Calcium 9.2 8.9 - 10.3 mg/dL   Total Protein 7.8 6.5 - 8.1 g/dL   Albumin 4.2 3.5 - 5.0 g/dL   AST 39 15 - 41 U/L    ALT 14 14 - 54 U/L   Alkaline Phosphatase 78 38 - 126 U/L   Total Bilirubin 0.6 0.3 - 1.2 mg/dL   GFR calc non Af Amer 17 (L) >60 mL/min   GFR calc Af Amer 19 (L) >60 mL/min    Comment: (NOTE) The eGFR has been calculated using the CKD EPI equation. This calculation has not been validated in all clinical situations. eGFR's persistently <60 mL/min signify possible Chronic Kidney Disease.    Anion gap 11 5 - 15  Ethanol     Status: None   Collection Time: 09/09/16  9:12 PM  Result Value Ref Range   Alcohol, Ethyl (B) <5 <5 mg/dL    Comment:        LOWEST DETECTABLE LIMIT FOR SERUM ALCOHOL IS 5 mg/dL FOR MEDICAL PURPOSES ONLY   Brain natriuretic peptide     Status: Abnormal   Collection Time: 09/09/16  9:12 PM  Result Value Ref Range   B Natriuretic Peptide 138.0 (H) 0.0 - 100.0 pg/mL  Troponin I     Status: None   Collection Time: 09/09/16  9:12 PM  Result Value Ref Range   Troponin I <0.03 <0.03 ng/mL  Urine Drug Screen, Qualitative ([facility] only)     Status: Abnormal   Collection Time: 09/09/16  9:14 PM  Result Value Ref Range   Tricyclic, Ur Screen NONE DETECTED NONE DETECTED   Amphetamines, Ur Screen NONE DETECTED NONE DETECTED   MDMA (Ecstasy)Ur Screen NONE DETECTED NONE DETECTED   Cocaine Metabolite,Ur [place] NONE DETECTED NONE DETECTED   Opiate, Ur Screen POSITIVE (A) NONE DETECTED   Phencyclidine (PCP) Ur S NONE DETECTED NONE DETECTED   Cannabinoid 50 Ng, Ur [place] NONE DETECTED NONE DETECTED   Barbiturates, Ur Screen NONE DETECTED NONE DETECTED   Benzodiazepine, Ur Scrn NONE DETECTED NONE DETECTED   Methadone Scn, Ur NONE DETECTED NONE DETECTED    Comment: (NOTE) 144  Tricyclics, urine               Cutoff 1000 ng/mL 200  Amphetamines, urine             Cutoff 1000 ng/mL 300  MDMA (Ecstasy), urine           Cutoff 500 ng/mL 400  Cocaine Metabolite, urine       Cutoff 300 ng/mL 500  Opiate, urine                   Cutoff 300 ng/mL 600  Phencyclidine (PCP), urine  Cutoff  25 ng/mL 700  Cannabinoid, urine              Cutoff 50 ng/mL 800  Barbiturates, urine             Cutoff 200 ng/mL 900  Benzodiazepine, urine           Cutoff 200 ng/mL 1000 Methadone, urine                Cutoff 300 ng/mL 1100 1200 The urine drug screen provides only a preliminary, unconfirmed 1300 analytical test result and should not be used for non-medical 1400 purposes. Clinical consideration and professional judgment should 1500 be applied to any positive drug screen result due to possible 1600 interfering substances. A more specific alternate chemical method 1700 must be used in order to obtain a confirmed analytical result.  1800 Gas chromato graphy / mass spectrometry (GC/MS) is the preferred 1900 confirmatory method.   Urinalysis, Complete w Microscopic     Status: Abnormal   Collection Time: 09/09/16  9:14 PM  Result Value Ref Range   Color, Urine YELLOW (A) YELLOW   APPearance HAZY (A) CLEAR   Specific Gravity, Urine 1.008 1.005 - 1.030   pH 5.0 5.0 - 8.0   Glucose, UA NEGATIVE NEGATIVE mg/dL   Hgb urine dipstick NEGATIVE NEGATIVE   Bilirubin Urine NEGATIVE NEGATIVE   Ketones, ur NEGATIVE NEGATIVE mg/dL   Protein, ur NEGATIVE NEGATIVE mg/dL   Nitrite NEGATIVE NEGATIVE   Leukocytes, UA NEGATIVE NEGATIVE   RBC / HPF NONE SEEN 0 - 5 RBC/hpf   WBC, UA 0-5 0 - 5 WBC/hpf   Bacteria, UA NONE SEEN NONE SEEN   Squamous Epithelial / LPF 0-5 (A) NONE SEEN   Mucous PRESENT    Hyaline Casts, UA PRESENT   Ammonia     Status: None   Collection Time: 09/09/16 10:40 PM  Result Value Ref Range   Ammonia 30 9 - 35 umol/L  Blood culture (routine x 2)     Status: None (Preliminary result)   Collection Time: 09/09/16 10:40 PM  Result Value Ref Range   Specimen Description BLOOD BLOOD RIGHT HAND    Special Requests      BOTTLES DRAWN AEROBIC AND ANAEROBIC Blood Culture results may not be optimal due to an inadequate volume of blood received in culture bottles   Culture NO GROWTH <  12 HOURS    Report Status PENDING   Blood culture (routine x 2)     Status: None (Preliminary result)   Collection Time: 09/09/16 10:40 PM  Result Value Ref Range   Specimen Description BLOOD BLOOD RIGHT FOREARM    Special Requests      BOTTLES DRAWN AEROBIC AND ANAEROBIC Blood Culture results may not be optimal due to an inadequate volume of blood received in culture bottles   Culture NO GROWTH < 12 HOURS    Report Status PENDING   Blood gas, venous     Status: Abnormal (Preliminary result)   Collection Time: 09/09/16 10:45 PM  Result Value Ref Range   pH, Ven 7.37 7.250 - 7.430   pCO2, Ven 43 (L) 44.0 - 60.0 mmHg   pO2, Ven PENDING 32.0 - 45.0 mmHg   Bicarbonate 24.9 20.0 - 28.0 mmol/L   Acid-base deficit 0.5 0.0 - 2.0 mmol/L   O2 Saturation PENDING %   Patient temperature 37.0    Collection site VENOUS    Sample type VENOUS   CBC     Status:  Abnormal   Collection Time: 09/10/16  3:52 AM  Result Value Ref Range   WBC 9.3 3.6 - 11.0 K/uL   RBC 3.71 (L) 3.80 - 5.20 MIL/uL   Hemoglobin 10.8 (L) 12.0 - 16.0 g/dL   HCT 32.8 (L) 35.0 - 47.0 %   MCV 88.3 80.0 - 100.0 fL   MCH 29.1 26.0 - 34.0 pg   MCHC 33.0 32.0 - 36.0 g/dL   RDW 14.3 11.5 - 14.5 %   Platelets 222 150 - 440 K/uL  Comprehensive metabolic panel     Status: Abnormal   Collection Time: 09/10/16  3:52 AM  Result Value Ref Range   Sodium 141 135 - 145 mmol/L   Potassium 4.2 3.5 - 5.1 mmol/L   Chloride 105 101 - 111 mmol/L   CO2 26 22 - 32 mmol/L   Glucose, Bld 113 (H) 65 - 99 mg/dL   BUN 42 (H) 6 - 20 mg/dL   Creatinine, Ser 2.44 (H) 0.44 - 1.00 mg/dL   Calcium 9.1 8.9 - 10.3 mg/dL   Total Protein 7.6 6.5 - 8.1 g/dL   Albumin 3.9 3.5 - 5.0 g/dL   AST 37 15 - 41 U/L   ALT 15 14 - 54 U/L   Alkaline Phosphatase 73 38 - 126 U/L   Total Bilirubin 0.8 0.3 - 1.2 mg/dL   GFR calc non Af Amer 18 (L) >60 mL/min   GFR calc Af Amer 21 (L) >60 mL/min    Comment: (NOTE) The eGFR has been calculated using the CKD EPI  equation. This calculation has not been validated in all clinical situations. eGFR's persistently <60 mL/min signify possible Chronic Kidney Disease.    Anion gap 10 5 - 15  Magnesium     Status: None   Collection Time: 09/10/16  3:52 AM  Result Value Ref Range   Magnesium 1.9 1.7 - 2.4 mg/dL  Phosphorus     Status: None   Collection Time: 09/10/16  3:52 AM  Result Value Ref Range   Phosphorus 4.5 2.5 - 4.6 mg/dL  Troponin I     Status: None   Collection Time: 09/10/16  3:52 AM  Result Value Ref Range   Troponin I <0.03 <0.03 ng/mL  TSH     Status: None   Collection Time: 09/10/16  3:52 AM  Result Value Ref Range   TSH 0.415 0.350 - 4.500 uIU/mL    Comment: Performed by a 3rd Generation assay with a functional sensitivity of <=0.01 uIU/mL.  Troponin I     Status: None   Collection Time: 09/10/16  8:46 AM  Result Value Ref Range   Troponin I <0.03 <0.03 ng/mL  Troponin I     Status: None   Collection Time: 09/10/16  2:50 PM  Result Value Ref Range   Troponin I <0.03 <0.03 ng/mL    Current Facility-Administered Medications  Medication Dose Route Frequency Provider Last Rate Last Dose  . 0.9 %  sodium chloride infusion   Intravenous Continuous Theodoro Grist, MD 75 mL/hr at 09/10/16 1238    . acetaminophen (TYLENOL) tablet 650 mg  650 mg Oral Q6H PRN Hugelmeyer, Alexis, DO       Or  . acetaminophen (TYLENOL) suppository 650 mg  650 mg Rectal Q6H PRN Hugelmeyer, Alexis, DO      . albuterol (PROVENTIL) (2.5 MG/3ML) 0.083% nebulizer solution 2.5 mg  2.5 mg Nebulization Q6H PRN Hugelmeyer, Alexis, DO      . bisacodyl (DULCOLAX) EC tablet 5  mg  5 mg Oral Daily PRN Hugelmeyer, Alexis, DO      . DULoxetine (CYMBALTA) DR capsule 30 mg  30 mg Oral Daily Theodoro Grist, MD   30 mg at 09/10/16 1237  . enoxaparin (LOVENOX) injection 40 mg  40 mg Subcutaneous Q24H Hugelmeyer, Alexis, DO   40 mg at 09/10/16 0954  . ipratropium (ATROVENT) nebulizer solution 0.5 mg  0.5 mg Nebulization Q6H  PRN Hugelmeyer, Alexis, DO      . [START ON 09/11/2016] linaclotide (LINZESS) capsule 145 mcg  145 mcg Oral QAC breakfast Theodoro Grist, MD      . magnesium citrate solution 1 Bottle  1 Bottle Oral Once PRN Hugelmeyer, Alexis, DO      . misoprostol (CYTOTEC) tablet 200 mcg  200 mcg Oral BID Theodoro Grist, MD   200 mcg at 09/10/16 1237  . montelukast (SINGULAIR) tablet 10 mg  10 mg Oral QHS [place], Rima, MD      . morphine (MS CONTIN) 12 hr tablet 30 mg  30 mg Oral Q12H [place]ickute, Rima, MD      . ondansetron (ZOFRAN) tablet 4 mg  4 mg Oral Q6H PRN Hugelmeyer, Alexis, DO       Or  . ondansetron (ZOFRAN) injection 4 mg  4 mg Intravenous Q6H PRN Hugelmeyer, Alexis, DO      . senna-docusate (Senokot-S) tablet 1 tablet  1 tablet Oral QHS PRN Hugelmeyer, Alexis, DO        Musculoskeletal: Strength & Muscle Tone: within normal limits Gait & Station: unable to stand Patient leans: N/A  Psychiatric Specialty Exam: Physical Exam  ROS  Blood pressure (!) 103/51, pulse 76, temperature 97.5 F (36.4 C), temperature source Oral, resp. rate 18, height _0  (1.727 m), weight (!) 139 kg (306 lb 8 oz), SpO2 100 %.Body mass index is 46.6 kg/m.  General Appearance: Well Groomed  Eye Contact:  Good  Speech:  Clear and Coherent  Volume:  Normal  Mood:  Euthymic  Affect:  Appropriate and Congruent  Thought Process:  Linear and Descriptions of Associations: Intact  Orientation:  Full (Time, Place, and Person)  Thought Content:  Hallucinations: None  Suicidal Thoughts:  No  Homicidal Thoughts:  No  Memory:  Immediate;   Good Recent;   Good Remote;   Good  Judgement:  Good  Insight:  Good  Psychomotor Activity:  Normal  Concentration:  Concentration: Good and Attention Span: Good  Recall:  Good  Fund of Knowledge:  Good  Language:  Good  Akathisia:  No  Handed:    AIMS (if indicated):     Assets:  Communication Skills Social Support  ADL's:  Intact  Cognition:  WNL  Sleep:         Treatment Plan Summary:  Patient is a 76 year old with no prior psychiatric history. She has a history of chronic pain. The patient was brought into the hospital after what appears to be an accidental overdose.  Patient is here in [place] from [place] for her mother's funeral.  Patient stated that she was recently prescribed with muscle relaxants that she to clinic in addition to her pain medications.  There is no history of abuse of substances or prescription medications  Patient denies any family history of mental illness. She denies history of suicidal attempts or any other psychiatric treatment.  Patient lives with family. She has a good relationship with her daughters.  I attempted to contact the daughter a couple times but appears that we have  day incorrect telephone number.  I will talk to the nursing staff to see if during visitation hours they can obtain daughters contact information   Disposition: Need Collateral information in order to complete consult  Hildred Priest, MD 09/10/2016 4:05 PM

## 2016-09-10 NOTE — Progress Notes (Signed)
Came looking pt twice but not in her room.

## 2016-09-10 NOTE — Progress Notes (Signed)
PT Cancellation Note  Patient Details Name: Lydia Barton MRN: [id number] DOB: 06-26-1940   Cancelled Treatment:    Reason Eval/Treat Not Completed: Other (comment) Consult received and chart reviewed. Second attempt to evaluate/treat; unable to see pt as she is having blood drawn, requiring extra time due to difficulty accessing vein. Staff requests therapy to return another time. Will re-attempt, in am.    Bevelyn Ngo 09/10/2016, 2:54 PM

## 2016-09-10 NOTE — Progress Notes (Signed)
Spoke with Tamela Oddi, pt's daughter today.  She does not believe pt OD intentionally. She confirms all other information provided by patient.  At this time there are no acute psychiatric issues identified.  Will sign off.

## 2016-09-10 NOTE — Progress Notes (Signed)
[facility] NAME: Lydia Barton    MR#:  [id number]  DATE OF BIRTH:  1941-02-18  SUBJECTIVE:  CHIEF COMPLAINT:   Chief Complaint  Patient presents with  . Loss of Consciousness   Patient is a 76 year old African American female with history of chronic pain syndrome, who is on MS Contin, who presents to the hospital with complaints of unresponsive episode after taking 2 tablets of MS Contin yesterday. She was brought to emergency room, given Narcan en route and improved clinically. She feels good today, complains of chronic pain. She admits of generalized weakness and depression after her recent mother's passing. The patient was seen by psychiatrist, who felt that it was accidental overdose. Review of Systems  Constitutional: Positive for malaise/fatigue. Negative for chills, fever and weight loss.  HENT: Negative for congestion.   Eyes: Negative for blurred vision and double vision.  Respiratory: Negative for cough, sputum production, shortness of breath and wheezing.   Cardiovascular: Negative for chest pain, palpitations, orthopnea, leg swelling and PND.  Gastrointestinal: Negative for abdominal pain, blood in stool, constipation, diarrhea, nausea and vomiting.  Genitourinary: Negative for dysuria, frequency, hematuria and urgency.  Musculoskeletal: Negative for falls.  Neurological: Negative for dizziness, tremors, focal weakness and headaches.  Endo/Heme/Allergies: Does not bruise/bleed easily.  Psychiatric/Behavioral: Positive for depression. The patient does not have insomnia.     VITAL SIGNS: Blood pressure (!) 103/51, pulse 76, temperature 97.5 F (36.4 C), temperature source Oral, resp. rate 18, height 5\' 8"  (1.727 m), weight (!) 139 kg (306 lb 8 oz), SpO2 100 %.  PHYSICAL EXAMINATION:   GENERAL:  76 y.o.-year-old obese patient lying in the bed with no acute distress.  EYES: Pupils equal, round, reactive to light  and accommodation. No scleral icterus. Extraocular muscles intact.  HEENT: Head atraumatic, normocephalic. Oropharynx and nasopharynx clear.  NECK:  Supple, no jugular venous distention. No thyroid enlargement, no tenderness.  LUNGS: Normal breath sounds bilaterally, no wheezing, rales,rhonchi or crepitation. No use of accessory muscles of respiration.  CARDIOVASCULAR: S1, S2 normal. No murmurs, rubs, or gallops.  ABDOMEN: Soft, nontender, nondistended. Bowel sounds present. No organomegaly or mass.  EXTREMITIES: No pedal edema, cyanosis, or clubbing.  NEUROLOGIC: Cranial nerves II through XII are intact. Muscle strength 5/5 in all extremities. Sensation intact. Gait not checked.  PSYCHIATRIC: The patient is alert and oriented x 3.  SKIN: No obvious rash, lesion, or ulcer.   ORDERS/RESULTS REVIEWED:   CBC  Recent Labs Lab 09/09/16 2112 09/10/16 0352  WBC 8.4 9.3  HGB 10.6* 10.8*  HCT 31.5* 32.8*  PLT 195 222  MCV 86.8 88.3  MCH 29.3 29.1  MCHC 33.7 33.0  RDW 14.5 14.3   ------------------------------------------------------------------------------------------------------------------  Chemistries   Recent Labs Lab 09/09/16 2112 09/10/16 0352  NA 139 141  K 4.4 4.2  CL 106 105  CO2 22 26  GLUCOSE 106* 113*  BUN 44* 42*  CREATININE 2.62* 2.44*  CALCIUM 9.2 9.1  MG  --  1.9  AST 39 37  ALT 14 15  ALKPHOS 78 73  BILITOT 0.6 0.8   ------------------------------------------------------------------------------------------------------------------ estimated creatinine clearance is 29.1 mL/min (A) (by C-G formula based on SCr of 2.44 mg/dL (H)). ------------------------------------------------------------------------------------------------------------------  Recent Labs  09/10/16 0352  TSH 0.415    Cardiac Enzymes  Recent Labs Lab 09/10/16 0352 09/10/16 0846 09/10/16 1450  TROPONINI <0.03 <0.03 <0.03    ------------------------------------------------------------------------------------------------------------------ Invalid input(s): POCBNP ---------------------------------------------------------------------------------------------------------------  RADIOLOGY: Ct Head  Wo Contrast  Result Date: 09/09/2016 CLINICAL DATA:  Unresponsive, agonal breathing EXAM: CT HEAD WITHOUT CONTRAST TECHNIQUE: Contiguous axial images were obtained from the base of the skull through the vertex without intravenous contrast. COMPARISON:  None. FINDINGS: Brain: No acute territorial infarction, hemorrhage or intracranial mass is seen. There is mild atrophy. The ventricles are nonenlarged. Empty sella with prominent CSF space noted. Vascular: No hyperdense vessels.  Carotid artery calcifications. Skull: No fracture or suspicious bone lesion Sinuses/Orbits: Mucosal thickening in the maxillary and ethmoid sinuses. No acute orbital abnormality. Other: None IMPRESSION: No CT evidence for acute intracranial abnormality. Electronically Signed   By: Donavan Foil M.D.   On: 09/09/2016 22:16   Ct Angio Chest Pe W And/or Wo Contrast  Result Date: 09/09/2016 CLINICAL DATA:  76 year old female with shortness of breath and altered mental status. EXAM: CT ANGIOGRAPHY CHEST WITH CONTRAST TECHNIQUE: Multidetector CT imaging of the chest was performed using the standard protocol during bolus administration of intravenous contrast. Multiplanar CT image reconstructions and MIPs were obtained to evaluate the vascular anatomy. CONTRAST:  75 cc intravenous Isovue 370 COMPARISON:  None. FINDINGS: Cardiovascular: This is a technically adequate study but respiratory motion artifact decreases sensitivity within portions of the lungs. No pulmonary emboli are identified. Cardiomegaly and coronary artery calcifications noted. There is no evidence of thoracic aortic aneurysm or pericardial effusion. Mediastinum/Nodes: No enlarged mediastinal, hilar, or  axillary lymph nodes. Thyroid gland, trachea, and esophagus demonstrate no significant findings. Lungs/Pleura: Minimal dependent atelectasis noted. There is no evidence of airspace disease, consolidation, mass, pleural effusion or pneumothorax. Upper Abdomen: Upper right kidney hydronephrosis versus parapelvic cysts noted. Musculoskeletal: No acute abnormality or suspicious bony lesions. Review of the MIP images confirms the above findings. IMPRESSION: No evidence of pulmonary emboli. Cardiomegaly and coronary artery disease. Upper right kidney hydronephrosis versus parapelvic cysts. Minimal dependent atelectasis. Electronically Signed   By: Margarette Canada M.D.   On: 09/09/2016 22:21   US Renal  Result Date: 09/10/2016 CLINICAL DATA:  Renal failure EXAM: RENAL / URINARY TRACT ULTRASOUND COMPLETE COMPARISON:  None. FINDINGS: Right Kidney: Length: 8.1 cm. No definite hydronephrosis Celsius seen. However, there is some fullness of the mid lower right collecting system which could represent parapelvic cysts with focal pelvocaliceal distention less likely. Left Kidney: Length: 7.5 cm.  No hydronephrosis is noted. Bladder: The urinary bladder is moderately distended with no abnormality noted. IMPRESSION: No definite hydronephrosis. Slight distention of the mid lower right collecting system could be due to parapelvic cysts. Electronically Signed   By: Ivar Drape M.D.   On: 09/10/2016 11:46   US Venous Img Lower Bilateral  Result Date: 09/10/2016 CLINICAL DATA:  Bilateral lower extremity swelling EXAM: BILATERAL LOWER EXTREMITY VENOUS DOPPLER ULTRASOUND TECHNIQUE: Gray-scale sonography with graded compression, as well as color Doppler and duplex ultrasound were performed to evaluate the lower extremity deep venous systems from the level of the common femoral vein and including the common femoral, femoral, profunda femoral, popliteal and calf veins including the posterior tibial, peroneal and gastrocnemius veins when  visible. The superficial great saphenous vein was also interrogated. Spectral Doppler was utilized to evaluate flow at rest and with distal augmentation maneuvers in the common femoral, femoral and popliteal veins. COMPARISON:  None. FINDINGS: RIGHT LOWER EXTREMITY Common Femoral Vein: No evidence of thrombus. Normal compressibility, respiratory phasicity and response to augmentation. Saphenofemoral Junction: No evidence of thrombus. Normal compressibility and flow on color Doppler imaging. Profunda Femoral Vein: No evidence of thrombus. Normal compressibility and flow  on color Doppler imaging. Femoral Vein: No evidence of thrombus. Normal compressibility, respiratory phasicity and response to augmentation. Popliteal Vein: No evidence of thrombus. Normal compressibility, respiratory phasicity and response to augmentation. Calf Veins: Limited visibility. Suboptimal evaluation due to edema and the patient's body habitus. Superficial Great Saphenous Vein: No evidence of thrombus. Normal compressibility and flow on color Doppler imaging. Venous Reflux:  None. Other Findings:  None. LEFT LOWER EXTREMITY Common Femoral Vein: No evidence of thrombus. Normal compressibility, respiratory phasicity and response to augmentation. Saphenofemoral Junction: No evidence of thrombus. Normal compressibility and flow on color Doppler imaging. Profunda Femoral Vein: No evidence of thrombus. Normal compressibility and flow on color Doppler imaging. Femoral Vein: No evidence of thrombus. Normal compressibility, respiratory phasicity and response to augmentation. Popliteal Vein: No evidence of thrombus. Normal compressibility, respiratory phasicity and response to augmentation. Calf Veins: Limited visibility. Suboptimal evaluation due to edema and the patient's body habitus. Superficial Great Saphenous Vein: No evidence of thrombus. Normal compressibility and flow on color Doppler imaging. Venous Reflux:  None. Other Findings:  None.  IMPRESSION: No evidence of DVT within the visualized veins of the lower extremities. The study is limited due to lower extremity swelling and the patient's body habitus. Electronically Signed   By: David  Martinique M.D.   On: 09/10/2016 12:32   Dg Chest Portable 1 View  Result Date: 09/09/2016 CLINICAL DATA:  Found unresponsive in parking lot EXAM: PORTABLE CHEST 1 VIEW COMPARISON:  CT 09/09/2016 FINDINGS: Cardiomegaly. Generous mediastinum. Mild central congestion. No focal consolidation or effusion. No pneumothorax. Surgical clips at the left thoracic inlet. Left axillary surgical clips. IMPRESSION: Cardiomegaly with central vascular congestion. No focal infiltrate is seen. Electronically Signed   By: Donavan Foil M.D.   On: 09/09/2016 22:33    EKG:  Orders placed or performed during the hospital encounter of 09/09/16  . ED EKG  . ED EKG  . EKG 12-Lead  . EKG 12-Lead  . EKG 12-Lead    ASSESSMENT AND PLAN:  Principal Problem:   Opiate overdose Active Problems:   Pressure injury of skin  #1. Opiate overdose, accidental, patient was seen by psychiatrist, recommended no further interventions at this time, although the psychiatrist was not able to talk to patient's daughter due to incorrect telephone number #2. Acute on chronic renal insufficiency, CKD stage 4,  continue IV fluids, follow creatinine in the morning, improving at present, urine analysis was unremarkable, unlikely UTI #3. Chronic pain syndrome, resume outpatient medications #4 anemia of chronic disease, likely due to CK D stage IV, follow labs, stable #5. Hyperglycemia, get hemoglobin A1c to rule out diabetes #6. Generalized weakness, physical therapist evaluation is pending #7. Depressed mood after patient's mother's passing, continue Cymbalta, may add Remeron for better sleep  Management plans discussed with the patient, family and they are in agreement.   DRUG ALLERGIES:  Allergies  Allergen Reactions  . Latex Hives      CODE STATUS:     Code Status Orders        Start     Ordered   09/10/16 0246  Full code  Continuous     09/10/16 0245    Code Status History    Date Active Date Inactive Code Status Order ID Comments User Context   This patient has a current code status but no historical code status.      TOTAL TIME TAKING CARE OF THIS PATIENT: 40 minutes.    Theodoro Grist M.D on 09/10/2016 at 4:58 PM  Between 7am to 6pm - Pager - [phone]  After 6pm go to [email or web address] - password [email or web address] [facility]  Office  [phone]  CC: Primary care physician; System, Pcp Not In

## 2016-09-10 NOTE — Progress Notes (Signed)
PT Cancellation Note  Patient Details Name: Lydia Barton MRN: [id number] DOB: 08-27-1940   Cancelled Treatment:    Reason Eval/Treat Not Completed: Other (comment). Eval attempted, however MD in room. After MD exited, new order for doppler to rule out DVT in chart. Will hold therapy evaluation until imaging completed and cleared for participation. Will re-attempt.   Ramy Greth 09/10/2016, 10:51 AM  Greggory Stallion, PT, DPT [phone]

## 2016-09-11 DIAGNOSIS — N289 Disorder of kidney and ureter, unspecified: Secondary | ICD-10-CM | POA: Diagnosis not present

## 2016-09-11 DIAGNOSIS — D638 Anemia in other chronic diseases classified elsewhere: Secondary | ICD-10-CM

## 2016-09-11 DIAGNOSIS — F329 Major depressive disorder, single episode, unspecified: Secondary | ICD-10-CM

## 2016-09-11 DIAGNOSIS — G894 Chronic pain syndrome: Secondary | ICD-10-CM

## 2016-09-11 DIAGNOSIS — R4589 Other symptoms and signs involving emotional state: Secondary | ICD-10-CM

## 2016-09-11 DIAGNOSIS — T402X1A Poisoning by other opioids, accidental (unintentional), initial encounter: Secondary | ICD-10-CM | POA: Diagnosis not present

## 2016-09-11 DIAGNOSIS — N189 Chronic kidney disease, unspecified: Secondary | ICD-10-CM

## 2016-09-11 DIAGNOSIS — R739 Hyperglycemia, unspecified: Secondary | ICD-10-CM

## 2016-09-11 DIAGNOSIS — R531 Weakness: Secondary | ICD-10-CM

## 2016-09-11 LAB — CREATININE, SERUM
Creatinine, Ser: 1.84 mg/dL — ABNORMAL HIGH (ref 0.44–1.00)
GFR, EST AFRICAN AMERICAN: 30 mL/min — AB (ref >60)
GFR, EST NON AFRICAN AMERICAN: 26 mL/min — AB (ref >60)

## 2016-09-11 LAB — BLOOD CULTURE ID PANEL (REFLEXED)
Acinetobacter baumannii: NOT DETECTED
Candida albicans: NOT DETECTED
Candida glabrata: NOT DETECTED
Candida krusei: NOT DETECTED
Candida parapsilosis: NOT DETECTED
Candida tropicalis: NOT DETECTED
Enterobacter cloacae complex: NOT DETECTED
Enterobacteriaceae species: NOT DETECTED
Enterococcus species: NOT DETECTED
Escherichia coli: NOT DETECTED
HAEMOPHILUS INFLUENZAE: NOT DETECTED
Klebsiella oxytoca: NOT DETECTED
Klebsiella pneumoniae: NOT DETECTED
Listeria monocytogenes: NOT DETECTED
NEISSERIA MENINGITIDIS: NOT DETECTED
PROTEUS SPECIES: NOT DETECTED
PSEUDOMONAS AERUGINOSA: NOT DETECTED
SERRATIA MARCESCENS: NOT DETECTED
STAPHYLOCOCCUS AUREUS BCID: NOT DETECTED
STAPHYLOCOCCUS SPECIES: NOT DETECTED
STREPTOCOCCUS AGALACTIAE: NOT DETECTED
STREPTOCOCCUS PNEUMONIAE: NOT DETECTED
STREPTOCOCCUS SPECIES: NOT DETECTED
Streptococcus pyogenes: NOT DETECTED

## 2016-09-11 LAB — HEMOGLOBIN A1C
HEMOGLOBIN A1C: 6.1 % — AB (ref 4.8–5.6)
MEAN PLASMA GLUCOSE: 128 mg/dL

## 2016-09-11 MED ORDER — BISACODYL 5 MG PO TBEC: 5.0000 mg | DELAYED_RELEASE_TABLET | Freq: Every day | ORAL | 0 refills | Status: AC | PRN

## 2016-09-11 MED ORDER — SENNOSIDES-DOCUSATE SODIUM 8.6-50 MG PO TABS: 1.0000 | ORAL_TABLET | Freq: Every evening | ORAL | 3 refills | Status: AC | PRN

## 2016-09-11 NOTE — Care Management (Signed)
Patient admitted with accidental opiate overdose.  Patient is visiting from [place].  Patient lives at home with daughter.  Patient states that she uses a power WC in the home.  Patient also has a manuel WC, hospital bed, and BSC.  Patient states that she has previously been open with home health services.  PT has assessed patient and recommended home health PT.  Home health agency preference was provided.  Patient declines home health services at this time.  Patient states that she wishes to follow up with her PCP and arrange home health if needed.  Patient states " I have used a company before, but can't remember who it was.  I rather just have my doctor set me up with them again".  Family to transport at discharge, no further RNCM needs identified.

## 2016-09-11 NOTE — Plan of Care (Signed)
Problem: Pain Managment: Goal: General experience of comfort will improve Outcome: Progressing Pt has chronic pain, treated with scheduled morphine that gives her relief.  Problem: Activity: Goal: Risk for activity intolerance will decrease Outcome: Progressing Up to [facility] with one assist tolerated well.

## 2016-09-11 NOTE — Care Management Important Message (Signed)
Important Message  Patient Details  Name: Lydia Barton MRN: [id number] Date of Birth: 09/11/1940   Medicare Important Message Given:  N/A - LOS <3 / Initial given by admissions    Beverly Sessions, RN 09/11/2016, 2:03 PM

## 2016-09-11 NOTE — Discharge Summary (Signed)
[name] NAME: Lydia Barton    MR#:  [id number]  DATE OF BIRTH:  Mar 29, 1940  DATE OF ADMISSION:  09/09/2016 ADMITTING PHYSICIAN: Harvie Bridge, DO  DATE OF DISCHARGE: 09/11/2016  2:45 PM  PRIMARY CARE PHYSICIAN: System, Pcp Not In     ADMISSION DIAGNOSIS:  Renal insufficiency [N28.9] Unresponsiveness [R41.89] Narcotic-induced respiratory depression [G93.89, T40.605A] Opioid overdose, accidental or unintentional, initial encounter [T40.2X1A] Opiate overdose, accidental or unintentional, initial encounter [T40.601A]  DISCHARGE DIAGNOSIS:  Principal Problem:   Opiate overdose Active Problems:   Pressure injury of skin   Acute on chronic renal insufficiency   Chronic pain syndrome   Anemia of chronic disease   Hyperglycemia   Generalized weakness   Depressed mood   SECONDARY DIAGNOSIS:   Past Medical History:  Diagnosis Date  . Back pain   . Cancer ([place])   . Hypertension     .pro HOSPITAL COURSE:  Patient is a 76 year old African American female with history of chronic pain syndrome, who is on MS Contin, who presents to the hospital with complaints of unresponsive episode after taking 2 tablets of MS Contin yesterday. She was brought to emergency room, given Narcan en route and improved clinically. She feels good today, complains of chronic pain. She admits of generalized weakness and depression after her recent mother's passing. The patient was seen by psychiatrist, who felt that it was accidental overdose.The patient was felt to be stable to be discharged home today Discussion by problem: #1. Opiate overdose, accidental, patient was seen by psychiatrist, recommended no further interventions at this time, although the psychiatrist was not able to talk to patient's daughter due to incorrect telephone number, patient's opiates were resumed and patient is clinically stable #2. Acute on chronic renal  insufficiency, CKD stage 4, the patient received some  IV fluids while in the hospital with minimal creatinine improvement,  urine analysis was unremarkable, unlikely UTI #3. Chronic pain syndrome, resume outpatient medications, mental status remains stable #4 anemia of chronic disease, likely due to CK D stage IV, follow labs as outpatient, follow-up with primary nephrologist in [place], where patient is from #5. Hyperglycemia, hemoglobin A1c was 6.1, patient does have prediabetes , diet modification was recommended #6. Generalized weakness, physical therapist evaluated patient and patient felt somewhat comfortable to be discharged, she is nonambulatory, but to chair transfers only, which she was able to perform, according to her #7. Depressed mood after patient's mother's passing, continue Cymbalta, needs to follow-up with primary care physician for advancement of medications, if needed  DISCHARGE CONDITIONS:   Stable  CONSULTS OBTAINED:  Treatment Team:  Hildred Priest, MD  DRUG ALLERGIES:   Allergies  Allergen Reactions  . Latex Hives    DISCHARGE MEDICATIONS:   Discharge Medication List as of 09/11/2016  1:43 PM    START taking these medications   Details  bisacodyl (DULCOLAX) 5 MG EC tablet Take 1 tablet (5 mg total) by mouth daily as needed for moderate constipation., Starting Wed 09/11/2016, Print    senna-docusate (SENOKOT-S) 8.6-50 MG tablet Take 1 tablet by mouth at bedtime as needed for mild constipation., Starting Wed 09/11/2016, Print      CONTINUE these medications which have NOT CHANGED   Details  DULoxetine (CYMBALTA) 30 MG capsule Take 30 mg by mouth daily., Historical Med    linaclotide (LINZESS) 145 MCG CAPS capsule Take 145 mcg by mouth daily before breakfast., Historical Med    misoprostol (  CYTOTEC) 200 MCG tablet Take 200 mcg by mouth 2 (two) times daily., Historical Med    montelukast (SINGULAIR) 10 MG tablet Take 10 mg by mouth at bedtime.,  Historical Med    morphine (MS CONTIN) 30 MG 12 hr tablet Take 30 mg by mouth every 12 (twelve) hours., Historical Med    nitroGLYCERIN (NITROSTAT) 0.4 MG SL tablet Place 0.4 mg under the tongue every 5 (five) minutes as needed for chest pain., Historical Med    ondansetron (ZOFRAN-ODT) 4 MG disintegrating tablet Take 4 mg by mouth every 8 (eight) hours as needed for nausea or vomiting., Historical Med    oxyCODONE-acetaminophen (PERCOCET) 10-325 MG tablet Take 1 tablet by mouth daily., Historical Med      STOP taking these medications     amLODipine (NORVASC) 10 MG tablet          DISCHARGE INSTRUCTIONS:    The patient is to follow-up with primary care physician as outpatient  If you experience worsening of your admission symptoms, develop shortness of breath, life threatening emergency, suicidal or homicidal thoughts you must seek medical attention immediately by calling 911 or calling your MD immediately  if symptoms less severe.  You Must read complete instructions/literature along with all the possible adverse reactions/side effects for all the Medicines you take and that have been prescribed to you. Take any new Medicines after you have completely understood and accept all the possible adverse reactions/side effects.   Please note  You were cared for by a hospitalist during your hospital stay. If you have any questions about your discharge medications or the care you received while you were in the hospital after you are discharged, you can call the unit and asked to speak with the hospitalist on call if the hospitalist that took care of you is not available. Once you are discharged, your primary care physician will handle any further medical issues. Please note that NO REFILLS for any discharge medications will be authorized once you are discharged, as it is imperative that you return to your primary care physician (or establish a relationship with a primary care physician if you  do not have one) for your aftercare needs so that they can reassess your need for medications and monitor your lab values.    Today   CHIEF COMPLAINT:   Chief Complaint  Patient presents with  . Loss of Consciousness    HISTORY OF PRESENT ILLNESS:     VITAL SIGNS:  Blood pressure (!) 114/54, pulse 78, temperature 98.2 F (36.8 C), temperature source Oral, resp. rate 17, height 5\' 8"  (1.727 m), weight (!) 139 kg (306 lb 8 oz), SpO2 98 %.  I/O:   Intake/Output Summary (Last 24 hours) at 09/11/16 1815 Last data filed at 09/11/16 1413  Gross per 24 hour  Intake          1933.75 ml  Output             1501 ml  Net           432.75 ml    PHYSICAL EXAMINATION:  GENERAL:  76 y.o.-year-old patient lying in the bed with no acute distress.  EYES: Pupils equal, round, reactive to light and accommodation. No scleral icterus. Extraocular muscles intact.  HEENT: Head atraumatic, normocephalic. Oropharynx and nasopharynx clear.  NECK:  Supple, no jugular venous distention. No thyroid enlargement, no tenderness.  LUNGS: Normal breath sounds bilaterally, no wheezing, rales,rhonchi or crepitation. No use of accessory muscles of respiration.  CARDIOVASCULAR: S1, S2 normal. No murmurs, rubs, or gallops.  ABDOMEN: Soft, non-tender, non-distended. Bowel sounds present. No organomegaly or mass.  EXTREMITIES: No pedal edema, cyanosis, or clubbing.  NEUROLOGIC: Cranial nerves II through XII are intact. Muscle strength 5/5 in all extremities. Sensation intact. Gait not checked.  PSYCHIATRIC: The patient is alert and oriented x 3.  SKIN: No obvious rash, lesion, or ulcer.   DATA REVIEW:   CBC  Recent Labs Lab 09/10/16 0352  WBC 9.3  HGB 10.8*  HCT 32.8*  PLT 222    Chemistries   Recent Labs Lab 09/10/16 0352 09/11/16 1320  NA 141  --   K 4.2  --   CL 105  --   CO2 26  --   GLUCOSE 113*  --   BUN 42*  --   CREATININE 2.44* 1.84*  CALCIUM 9.1  --   MG 1.9  --   AST 37  --     ALT 15  --   ALKPHOS 73  --   BILITOT 0.8  --     Cardiac Enzymes  Recent Labs Lab 09/10/16 1450  TROPONINI <0.03    Microbiology Results  Results for orders placed or performed during the hospital encounter of 09/09/16  Blood culture (routine x 2)     Status: None (Preliminary result)   Collection Time: 09/09/16 10:40 PM  Result Value Ref Range Status   Specimen Description BLOOD BLOOD RIGHT HAND  Final   Special Requests   Final    BOTTLES DRAWN AEROBIC AND ANAEROBIC Blood Culture results may not be optimal due to an inadequate volume of blood received in culture bottles   Culture NO GROWTH 2 DAYS  Final   Report Status PENDING  Incomplete  Blood culture (routine x 2)     Status: None (Preliminary result)   Collection Time: 09/09/16 10:40 PM  Result Value Ref Range Status   Specimen Description BLOOD BLOOD RIGHT FOREARM  Final   Special Requests   Final    BOTTLES DRAWN AEROBIC AND ANAEROBIC Blood Culture results may not be optimal due to an inadequate volume of blood received in culture bottles   Culture  Setup Time   Final    Organism ID to follow AEROBIC BOTTLE ONLY GRAM POSITIVE RODS CRITICAL RESULT CALLED TO, READ BACK BY AND VERIFIED WITH: MARY [place] 09/11/16 1604 KLW    Culture GRAM POSITIVE RODS  Final   Report Status PENDING  Incomplete  Blood Culture ID Panel (Reflexed)     Status: None   Collection Time: 09/09/16 10:40 PM  Result Value Ref Range Status   Enterococcus species NOT DETECTED NOT DETECTED Final   Listeria monocytogenes NOT DETECTED NOT DETECTED Final   Staphylococcus species NOT DETECTED NOT DETECTED Final   Staphylococcus aureus NOT DETECTED NOT DETECTED Final   Streptococcus species NOT DETECTED NOT DETECTED Final   Streptococcus agalactiae NOT DETECTED NOT DETECTED Final   Streptococcus pneumoniae NOT DETECTED NOT DETECTED Final   Streptococcus pyogenes NOT DETECTED NOT DETECTED Final   Acinetobacter baumannii NOT DETECTED NOT DETECTED  Final   Enterobacteriaceae species NOT DETECTED NOT DETECTED Final   Enterobacter cloacae complex NOT DETECTED NOT DETECTED Final   Escherichia coli NOT DETECTED NOT DETECTED Final   Klebsiella oxytoca NOT DETECTED NOT DETECTED Final   Klebsiella pneumoniae NOT DETECTED NOT DETECTED Final   Proteus species NOT DETECTED NOT DETECTED Final   Serratia marcescens NOT DETECTED NOT DETECTED Final   Haemophilus influenzae NOT  DETECTED NOT DETECTED Final   Neisseria meningitidis NOT DETECTED NOT DETECTED Final   Pseudomonas aeruginosa NOT DETECTED NOT DETECTED Final   Candida albicans NOT DETECTED NOT DETECTED Final   Candida glabrata NOT DETECTED NOT DETECTED Final   Candida krusei NOT DETECTED NOT DETECTED Final   Candida parapsilosis NOT DETECTED NOT DETECTED Final   Candida tropicalis NOT DETECTED NOT DETECTED Final    RADIOLOGY:  Ct Head Wo Contrast  Result Date: 09/09/2016 CLINICAL DATA:  Unresponsive, agonal breathing EXAM: CT HEAD WITHOUT CONTRAST TECHNIQUE: Contiguous axial images were obtained from the base of the skull through the vertex without intravenous contrast. COMPARISON:  None. FINDINGS: Brain: No acute territorial infarction, hemorrhage or intracranial mass is seen. There is mild atrophy. The ventricles are nonenlarged. Empty sella with prominent CSF space noted. Vascular: No hyperdense vessels.  Carotid artery calcifications. Skull: No fracture or suspicious bone lesion Sinuses/Orbits: Mucosal thickening in the maxillary and ethmoid sinuses. No acute orbital abnormality. Other: None IMPRESSION: No CT evidence for acute intracranial abnormality. Electronically Signed   By: Donavan Foil M.D.   On: 09/09/2016 22:16   Ct Angio Chest Pe W And/or Wo Contrast  Result Date: 09/09/2016 CLINICAL DATA:  76 year old female with shortness of breath and altered mental status. EXAM: CT ANGIOGRAPHY CHEST WITH CONTRAST TECHNIQUE: Multidetector CT imaging of the chest was performed using the  standard protocol during bolus administration of intravenous contrast. Multiplanar CT image reconstructions and MIPs were obtained to evaluate the vascular anatomy. CONTRAST:  75 cc intravenous Isovue 370 COMPARISON:  None. FINDINGS: Cardiovascular: This is a technically adequate study but respiratory motion artifact decreases sensitivity within portions of the lungs. No pulmonary emboli are identified. Cardiomegaly and coronary artery calcifications noted. There is no evidence of thoracic aortic aneurysm or pericardial effusion. Mediastinum/Nodes: No enlarged mediastinal, hilar, or axillary lymph nodes. Thyroid gland, trachea, and esophagus demonstrate no significant findings. Lungs/Pleura: Minimal dependent atelectasis noted. There is no evidence of airspace disease, consolidation, mass, pleural effusion or pneumothorax. Upper Abdomen: Upper right kidney hydronephrosis versus parapelvic cysts noted. Musculoskeletal: No acute abnormality or suspicious bony lesions. Review of the MIP images confirms the above findings. IMPRESSION: No evidence of pulmonary emboli. Cardiomegaly and coronary artery disease. Upper right kidney hydronephrosis versus parapelvic cysts. Minimal dependent atelectasis. Electronically Signed   By: Margarette Canada M.D.   On: 09/09/2016 22:21   US Renal  Result Date: 09/10/2016 CLINICAL DATA:  Renal failure EXAM: RENAL / URINARY TRACT ULTRASOUND COMPLETE COMPARISON:  None. FINDINGS: Right Kidney: Length: 8.1 cm. No definite hydronephrosis Celsius seen. However, there is some fullness of the mid lower right collecting system which could represent parapelvic cysts with focal pelvocaliceal distention less likely. Left Kidney: Length: 7.5 cm.  No hydronephrosis is noted. Bladder: The urinary bladder is moderately distended with no abnormality noted. IMPRESSION: No definite hydronephrosis. Slight distention of the mid lower right collecting system could be due to parapelvic cysts. Electronically  Signed   By: Ivar Drape M.D.   On: 09/10/2016 11:46   US Venous Img Lower Bilateral  Result Date: 09/10/2016 CLINICAL DATA:  Bilateral lower extremity swelling EXAM: BILATERAL LOWER EXTREMITY VENOUS DOPPLER ULTRASOUND TECHNIQUE: Gray-scale sonography with graded compression, as well as color Doppler and duplex ultrasound were performed to evaluate the lower extremity deep venous systems from the level of the common femoral vein and including the common femoral, femoral, profunda femoral, popliteal and calf veins including the posterior tibial, peroneal and gastrocnemius veins when visible. The superficial great  saphenous vein was also interrogated. Spectral Doppler was utilized to evaluate flow at rest and with distal augmentation maneuvers in the common femoral, femoral and popliteal veins. COMPARISON:  None. FINDINGS: RIGHT LOWER EXTREMITY Common Femoral Vein: No evidence of thrombus. Normal compressibility, respiratory phasicity and response to augmentation. Saphenofemoral Junction: No evidence of thrombus. Normal compressibility and flow on color Doppler imaging. Profunda Femoral Vein: No evidence of thrombus. Normal compressibility and flow on color Doppler imaging. Femoral Vein: No evidence of thrombus. Normal compressibility, respiratory phasicity and response to augmentation. Popliteal Vein: No evidence of thrombus. Normal compressibility, respiratory phasicity and response to augmentation. Calf Veins: Limited visibility. Suboptimal evaluation due to edema and the patient's body habitus. Superficial Great Saphenous Vein: No evidence of thrombus. Normal compressibility and flow on color Doppler imaging. Venous Reflux:  None. Other Findings:  None. LEFT LOWER EXTREMITY Common Femoral Vein: No evidence of thrombus. Normal compressibility, respiratory phasicity and response to augmentation. Saphenofemoral Junction: No evidence of thrombus. Normal compressibility and flow on color Doppler imaging. Profunda  Femoral Vein: No evidence of thrombus. Normal compressibility and flow on color Doppler imaging. Femoral Vein: No evidence of thrombus. Normal compressibility, respiratory phasicity and response to augmentation. Popliteal Vein: No evidence of thrombus. Normal compressibility, respiratory phasicity and response to augmentation. Calf Veins: Limited visibility. Suboptimal evaluation due to edema and the patient's body habitus. Superficial Great Saphenous Vein: No evidence of thrombus. Normal compressibility and flow on color Doppler imaging. Venous Reflux:  None. Other Findings:  None. IMPRESSION: No evidence of DVT within the visualized veins of the lower extremities. The study is limited due to lower extremity swelling and the patient's body habitus. Electronically Signed   By: David  Martinique M.D.   On: 09/10/2016 12:32   Dg Chest Portable 1 View  Result Date: 09/09/2016 CLINICAL DATA:  Found unresponsive in parking lot EXAM: PORTABLE CHEST 1 VIEW COMPARISON:  CT 09/09/2016 FINDINGS: Cardiomegaly. Generous mediastinum. Mild central congestion. No focal consolidation or effusion. No pneumothorax. Surgical clips at the left thoracic inlet. Left axillary surgical clips. IMPRESSION: Cardiomegaly with central vascular congestion. No focal infiltrate is seen. Electronically Signed   By: Donavan Foil M.D.   On: 09/09/2016 22:33    EKG:   Orders placed or performed during the hospital encounter of 09/09/16  . ED EKG  . ED EKG  . EKG 12-Lead  . EKG 12-Lead      Management plans discussed with the patient, family and they are in agreement.  CODE STATUS:  Code Status History    Date Active Date Inactive Code Status Order ID Comments User Context   09/10/2016  2:46 AM 09/11/2016  5:50 PM Full Code [id number]  Hugelmeyer, Ubaldo Glassing, DO ED      TOTAL TIME TAKING CARE OF THIS PATIENT: 40 minutes.    Theodoro Grist M.D on 09/11/2016 at 6:15 PM  Between 7am to 6pm - Pager - [phone]  After 6pm go to  [email or web address] - password [email or web address] [facility]  Office  [phone]  CC: Primary care physician; System, Pcp Not In

## 2016-09-11 NOTE — Progress Notes (Signed)
PHARMACY - PHYSICIAN COMMUNICATION CRITICAL VALUE ALERT - BLOOD CULTURE IDENTIFICATION (BCID)  Results for orders placed or performed during the hospital encounter of 09/09/16  Blood Culture ID Panel (Reflexed) (Collected: 09/09/2016 10:40 PM)  Result Value Ref Range   Enterococcus species NOT DETECTED NOT DETECTED   Listeria monocytogenes NOT DETECTED NOT DETECTED   Staphylococcus species NOT DETECTED NOT DETECTED   Staphylococcus aureus NOT DETECTED NOT DETECTED   Streptococcus species NOT DETECTED NOT DETECTED   Streptococcus agalactiae NOT DETECTED NOT DETECTED   Streptococcus pneumoniae NOT DETECTED NOT DETECTED   Streptococcus pyogenes NOT DETECTED NOT DETECTED   Acinetobacter baumannii NOT DETECTED NOT DETECTED   Enterobacteriaceae species NOT DETECTED NOT DETECTED   Enterobacter cloacae complex NOT DETECTED NOT DETECTED   Escherichia coli NOT DETECTED NOT DETECTED   Klebsiella oxytoca NOT DETECTED NOT DETECTED   Klebsiella pneumoniae NOT DETECTED NOT DETECTED   Proteus species NOT DETECTED NOT DETECTED   Serratia marcescens NOT DETECTED NOT DETECTED   Haemophilus influenzae NOT DETECTED NOT DETECTED   Neisseria meningitidis NOT DETECTED NOT DETECTED   Pseudomonas aeruginosa NOT DETECTED NOT DETECTED   Candida albicans NOT DETECTED NOT DETECTED   Candida glabrata NOT DETECTED NOT DETECTED   Candida krusei NOT DETECTED NOT DETECTED   Candida parapsilosis NOT DETECTED NOT DETECTED   Candida tropicalis NOT DETECTED NOT DETECTED    Name of physician (or Provider) Contacted: Dr. Ether Griffins  Changes to prescribed antibiotics required: Patient discharged today. No change at this time. Continue to monitor.  Lenis Noon, PharmD Clinical Pharmacist 09/11/2016  4:16 PM

## 2016-09-11 NOTE — Evaluation (Signed)
Physical Therapy Evaluation Patient Details Name: Lydia Barton MRN: [id number] DOB: [birth year] Today's Date: 09/11/2016   History of Present Illness  Pt is a 76 yo F, admitted to acute care with unintentional overdose. Pt is from [place]; in [place] for her mothers funeral. Prior to admission pt required minA with cmanaging clothing, but was ModI with all functional ADL's, using manual WC for mobility within the home; performing functional transfers with modI, using surfaces within home for UE support. PMH includes: hx of breast cancer with mastectomy, chronic back pain, and HTN.   Clinical Impression  Pt is a pleasant 76 y.o. F, admitted to acute care with unintentional overdose. Pt performs bed mobility at ModI level, having HOB elevated and using railings for UE support; pt has hospital bed at home, which has all of these features. Tranfers with supervision, requiring extra time due to chronic back pain; pt reports this is her baseline level of function. Ambulation performed a total of 5 feet from bed to chair; requires CGA due to LE weakness and chronic pain. Prior to admission, pt utilized RW as primary means of mobility. Pt presents with the following deficits: strength and endurance. Overall, pt responded well to today's treatment with no adverse affects. Pt would benefit from skilled PT to address the previously mentioned impairments and promote return to PLOF. Currently recommending HHPT, pending d/c.     Follow Up Recommendations Home health PT    Equipment Recommendations  None recommended by PT    Recommendations for Other Services       Precautions / Restrictions Precautions Precautions: Fall Restrictions Weight Bearing Restrictions: No      Mobility  Bed Mobility Overal bed mobility: Modified Independent             General bed mobility comments: ABle to transfer from supine to sit at ModI level, using bedrails for UE support and elevation of HOB to transition from  supine to sit; pt owns hospital bed at home and has access to these amenities.    Transfers Overall transfer level: Needs assistance   Transfers: Sit to/from Stand Sit to Stand: Supervision         General transfer comment: Pt supervision for STS, secondary to generalized LE weakness. Utilized RW for UE support once in standing.   Ambulation/Gait Ambulation/Gait assistance: Min guard Ambulation Distance (Feet): 5 Feet Assistive device: Rolling walker (2 wheeled) Gait Pattern/deviations: Decreased step length - right;Decreased step length - left;Decreased stance time - right;Decreased stance time - left;Shuffle     General Gait Details: CGA with amb from bed to chair; presents with good safety awareness and pt self-reports that she is at baseline level for amb. Utilized RW for UE support; per pt report, she has been transfering well without use of AD the entire morning.   Stairs            Wheelchair Mobility    Modified Rankin (Stroke Patients Only)       Balance Overall balance assessment: Needs assistance   Sitting balance-Leahy Scale: Good Sitting balance - Comments: Pt independent with sitting balance, requiring no cues or assist.    Standing balance support: Bilateral upper extremity supported Standing balance-Leahy Scale: Fair Standing balance comment: Pt supervision with static standing balance, using RW for B UE support, secondary to LE weakness and chronic back pain.  Pertinent Vitals/Pain Pain Assessment: 0-10 Pain Score: 8  Pain Location: Back  Pain Descriptors / Indicators: Discomfort;Constant (Chronic pain; not at it's worst; pt able to participate. ) Pain Intervention(s): Limited activity within patient's tolerance;Monitored during session;Premedicated before session    Towner expects to be discharged to:: Private residence Living Arrangements: Children (Grandson: 18 yo) Available Help  at Discharge: Family Type of Home: House Home Access: Level entry     Home Layout: One level (2 stairs to [employer]; pt doesnt enter den. ) Home Equipment: Gilford Rile - 4 wheels;Electric scooter;Wheelchair - manual [facility] bed) Additional Comments: Pt has hospital bed within home, so is able to elevate HOB and use hand rails to transition from supine to sit.     Prior Function Level of Independence: Needs assistance (MinA for managing clothing. ModI with AD for other ADL's.)   Gait / Transfers Assistance Needed: Non-ambulatory, using WC as primary means of mobiliy; ModI with functional transfers, using obhects within home for UE support.            Hand Dominance        Extremity/Trunk Assessment   Upper Extremity Assessment Upper Extremity Assessment: Overall WFL for tasks assessed    Lower Extremity Assessment Lower Extremity Assessment: Generalized weakness (MMT: B LE's grossly 4-/5; hip flex 2+/5)       Communication   Communication: No difficulties  Cognition Arousal/Alertness: Awake/alert Behavior During Therapy: WFL for tasks assessed/performed Overall Cognitive Status: Within Functional Limits for tasks assessed                                        General Comments      Exercises Other Exercises Other Exercises: Supine therex performed to B LE's with supervision x10 reps: ankle pumps, quad sets, glute sets, and hip abd. Attempted SLR, but pt was unable to perform, likely due to chronic back pain   Assessment/Plan    PT Assessment Patient needs continued PT services  PT Problem List Decreased strength;Decreased range of motion;Decreased activity tolerance;Decreased balance;Decreased mobility;Decreased coordination;Decreased safety awareness;Pain       PT Treatment Interventions DME instruction;Gait training;Stair training;Functional mobility training;Therapeutic activities;Therapeutic exercise;Balance training;Neuromuscular  re-education;Patient/family education    PT Goals (Current goals can be found in the Care Plan section)  Acute Rehab PT Goals Patient Stated Goal: To go home  PT Goal Formulation: With patient Time For Goal Achievement: 09/25/16 Potential to Achieve Goals: Good    Frequency Min 2X/week   Barriers to discharge        Co-evaluation               AM-PAC PT "6 Clicks" Daily Activity  Outcome Measure Difficulty turning over in bed (including adjusting bedclothes, sheets and blankets)?: A Lot Difficulty moving from lying on back to sitting on the side of the bed? : A Lot Difficulty sitting down on and standing up from a chair with arms (e.g., wheelchair, bedside commode, etc,.)?: A Lot Help needed moving to and from a bed to chair (including a wheelchair)?: A Little Help needed walking in hospital room?: A Little Help needed climbing 3-5 steps with a railing? : Total 6 Click Score: 13    End of Session Equipment Utilized During Treatment: Gait belt Activity Tolerance: Patient tolerated treatment well Patient left: in chair;with call bell/phone within reach;with chair alarm set;with SCD's reapplied Nurse Communication: Mobility status PT Visit Diagnosis:  Unsteadiness on feet (R26.81);Other abnormalities of gait and mobility (R26.89);Muscle weakness (generalized) (M62.81);History of falling (Z91.81);Pain    Time: 8325-4982 PT Time Calculation (min) (ACUTE ONLY): 30 min   Charges:         PT G Codes:        Oran Rein PT, SPT  Bevelyn Ngo 09/11/2016, 1:15 PM

## 2016-09-11 NOTE — Progress Notes (Signed)
Discharge instructions explained to ptand pts family/ verbalized an understanding/ iv and tele removed/ RX given to pt/ will transport off unit via wheelchair.  

## 2016-09-12 LAB — BLOOD GAS, VENOUS
ACID-BASE DEFICIT: 0.5 mmol/L (ref 0.0–2.0)
BICARBONATE: 24.9 mmol/L (ref 20.0–28.0)
PCO2 VEN: 43 mmHg — AB (ref 44.0–60.0)
PH VEN: 7.37 (ref 7.250–7.430)
Patient temperature: 37

## 2016-09-13 LAB — CULTURE, BLOOD (ROUTINE X 2)

## 2016-09-14 LAB — CULTURE, BLOOD (ROUTINE X 2): Culture: NO GROWTH

## 2016-09-24 NOTE — Progress Notes (Signed)
09/11/16 1251  PT Visit Information  Last PT Received On 09/11/16  Assistance Needed +1  History of Present Illness Pt is a 76 yo F, admitted to acute care with unintentional overdose. Pt is from [place]; in [place] for her mothers funeral. Prior to admission pt required minA with cmanaging clothing, but was ModI with all functional ADL's, using manual WC for mobility within the home; performing functional transfers with modI, using surfaces within home for UE support. PMH includes: hx of breast cancer with mastectomy, chronic back pain, and HTN.   Precautions  Precautions Fall  Restrictions  Weight Bearing Restrictions No  Home Living  Family/patient expects to be discharged to: Private residence  Living Arrangements Children (Grandson: 14 yo)  Available Help at Discharge Family  Type of [place] Access Level entry  [place] One level (2 stairs to den; pt doesnt enter den. )  Research scientist (life sciences) - 4 wheels;Electric scooter;Wheelchair - manual [facility] bed)  Additional Comments Pt has hospital bed within home, so is able to elevate HOB and use hand rails to transition from supine to sit.   Prior Function  Level of Independence Needs assistance (MinA for managing clothing. ModI with AD for other ADL's.)  Gait / Transfers Assistance Needed Non-ambulatory, using WC as primary means of mobiliy; ModI with functional transfers, using obhects within home for UE support.   Communication  Communication No difficulties  Pain Assessment  Pain Assessment 0-10  Pain Score 8  Pain Location Back   Pain Descriptors / Indicators Discomfort;Constant (Chronic pain; not at it's worst; pt able to participate. )  Pain Intervention(s) Limited activity within patient's tolerance;Monitored during session;Premedicated before session  Cognition  Arousal/Alertness Awake/alert  Behavior During Therapy [facility] for tasks assessed/performed  Overall Cognitive Status Within Functional Limits for tasks  assessed  Upper Extremity Assessment  Upper Extremity Assessment Overall WFL for tasks assessed  Lower Extremity Assessment  Lower Extremity Assessment Generalized weakness (MMT: B LE's grossly 4-/5; hip flex 2+/5)  Bed Mobility  Overal bed mobility Modified Independent  General bed mobility comments ABle to transfer from supine to sit at ModI level, using bedrails for UE support and elevation of HOB to transition from supine to sit; pt owns hospital bed at home and has access to these amenities.    Transfers  Overall transfer level Needs assistance  Transfers Sit to/from Stand  Sit to Stand Supervision  General transfer comment Pt supervision for STS, secondary to generalized LE weakness. Utilized RW for UE support once in standing.   Ambulation/Gait  Ambulation/Gait assistance Min guard  Ambulation Distance (Feet) 5 Feet  Assistive device Rolling walker (2 wheeled)  Gait Pattern/deviations Decreased step length - right;Decreased step length - left;Decreased stance time - right;Decreased stance time - left;Shuffle  General Gait Details CGA with amb from bed to chair; presents with good safety awareness and pt self-reports that she is at baseline level for amb. Utilized RW for UE support; per pt report, she has been transfering well without use of AD the entire morning.   Balance  Overall balance assessment Needs assistance  Sitting balance-Leahy Scale Good  Sitting balance - Comments Pt independent with sitting balance, requiring no cues or assist.   Standing balance support Bilateral upper extremity supported  Standing balance-Leahy Scale Fair  Standing balance comment Pt supervision with static standing balance, using RW for B UE support, secondary to LE weakness and chronic back pain.   Exercises  Exercises Other exercises  Other  Exercises  Other Exercises Supine therex performed to B LE's with supervision x10 reps: ankle pumps, quad sets, glute sets, and hip abd. Attempted SLR, but  pt was unable to perform, likely due to chronic back pain  PT - End of Session  Equipment Utilized During Treatment Gait belt  Activity Tolerance Patient tolerated treatment well  Patient left in chair;with call bell/phone within reach;with chair alarm set;with SCD's reapplied  Nurse Communication Mobility status  PT Assessment  PT Recommendation/Assessment Patient needs continued PT services  PT Visit Diagnosis Unsteadiness on feet (R26.81);Other abnormalities of gait and mobility (R26.89);Muscle weakness (generalized) (M62.81);History of falling (Z91.81);Pain  PT Problem List Decreased strength;Decreased range of motion;Decreased activity tolerance;Decreased balance;Decreased mobility;Decreased coordination;Decreased safety awareness;Pain  PT Plan  PT Frequency (ACUTE ONLY) Min 2X/week  PT Treatment/Interventions (ACUTE ONLY) DME instruction;Gait training;Stair training;Functional mobility training;Therapeutic activities;Therapeutic exercise;Balance training;Neuromuscular re-education;Patient/family education  AM-PAC PT "6 Clicks" Daily Activity Outcome Measure  Difficulty turning over in bed (including adjusting bedclothes, sheets and blankets)? 2  Difficulty moving from lying on back to sitting on the side of the bed?  2  Difficulty sitting down on and standing up from a chair with arms (e.g., wheelchair, bedside commode, etc,.)? 2  Help needed moving to and from a bed to chair (including a wheelchair)? 3  Help needed walking in hospital room? 3  Help needed climbing 3-5 steps with a railing?  1  6 Click Score 13  Mobility G Code  CK  PT Recommendation  Follow Up Recommendations Home health PT  PT equipment None recommended by PT  Individuals Consulted  Consulted and Agree with Results and Recommendations Patient  Acute Rehab PT Goals  Patient Stated Goal To go home   PT Goal Formulation With patient  Time For Goal Achievement 09/25/16  Potential to Achieve Goals Good  PT Time  Calculation  PT Start Time (ACUTE ONLY) 1029  PT Stop Time (ACUTE ONLY) 1059  PT Time Calculation (min) (ACUTE ONLY) 30 min  PT G-Codes **NOT FOR INPATIENT CLASS**  Functional Assessment Tool Used AM-PAC 6 Clicks Basic Mobility  Functional Limitation Mobility: Walking and moving around  Mobility: Walking and Moving Around Current Status (J6967) CK  Mobility: Walking and Moving Around Goal Status (E9381) CJ  PT General Charges  $$ ACUTE PT VISIT 1 Procedure  PT Evaluation  $PT Eval Moderate Complexity 1 Procedure  PT Treatments  $Therapeutic Exercise 8-22 mins  Late G code entry.  Greggory Stallion, PT, DPT [phone]

## 2017-12-05 IMAGING — CT CT ANGIO CHEST
2 of 7 series · 18 of 36 positions shown · IV contrast (APPLIED)
Comparison: None.

CLINICAL DATA: 75-year-old female with shortness of breath and
altered mental status.

EXAM:
CT ANGIOGRAPHY CHEST WITH CONTRAST
TECHNIQUE: Multidetector CT imaging of the chest was performed using the
standard protocol during bolus administration of intravenous
contrast. Multiplanar CT image reconstructions and MIPs were
obtained to evaluate the vascular anatomy.
CONTRAST:  75 cc intravenous Isovue 370

[Series 5: thins · axial · 0.72mm/px · z∈[-141,+100]mm · 15 of 277 slices shown]
[im 18/277  lung]
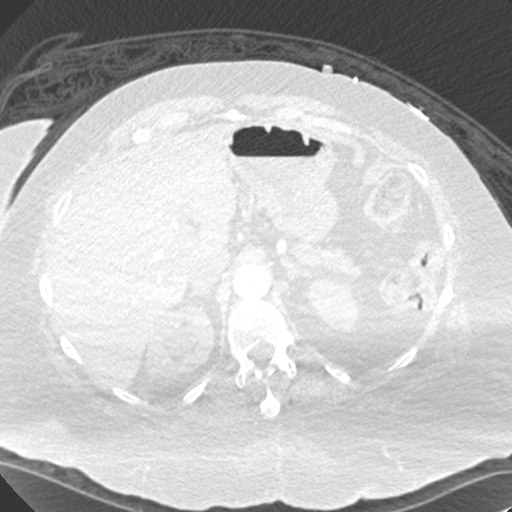
[im 35/277  mediastinal]
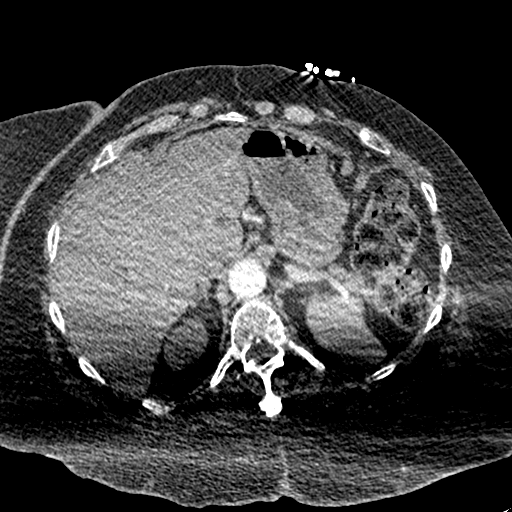
[im 52/277  lung]
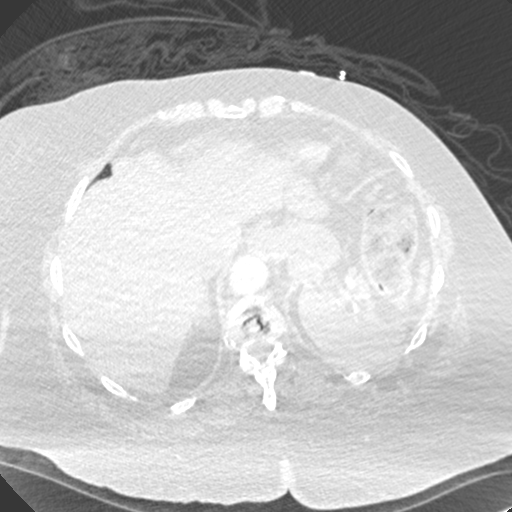
[im 70/277  mediastinal]
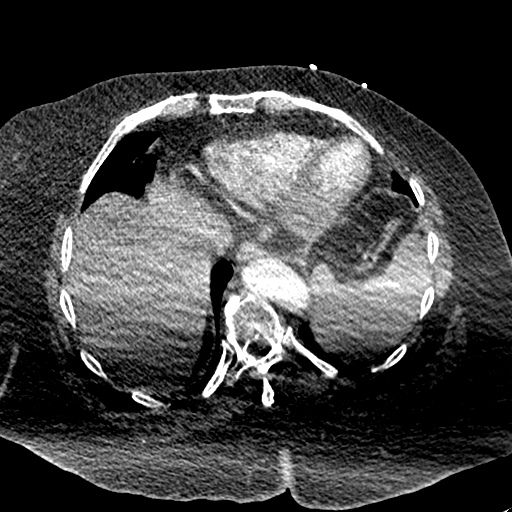
[im 87/277  lung]
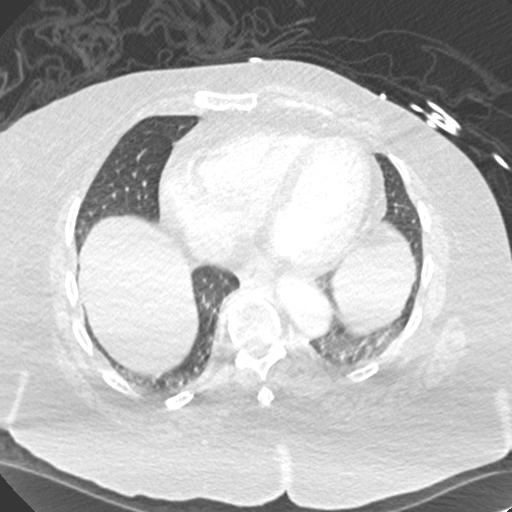
[im 104/277  mediastinal]
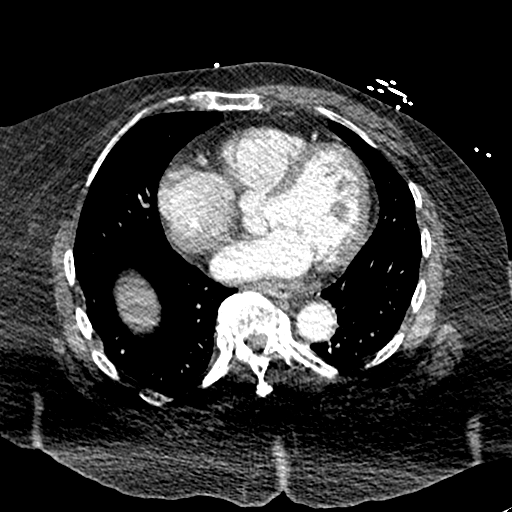
[im 121/277  lung]
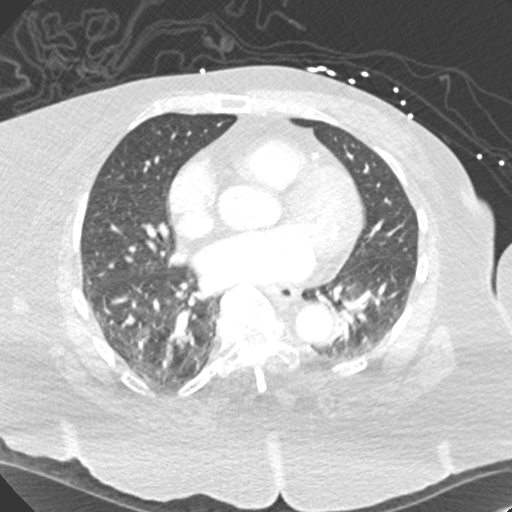
[im 139/277  mediastinal]
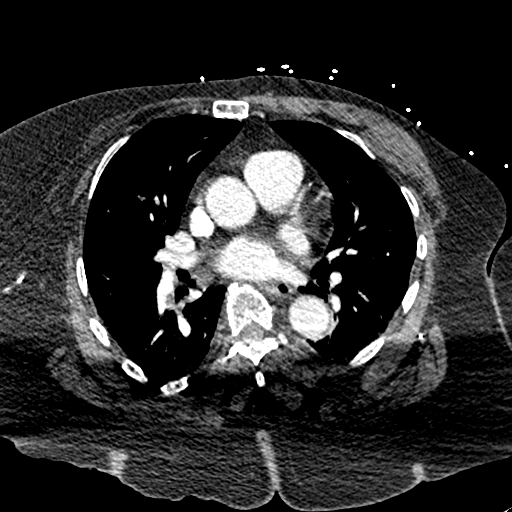
[im 156/277  lung]
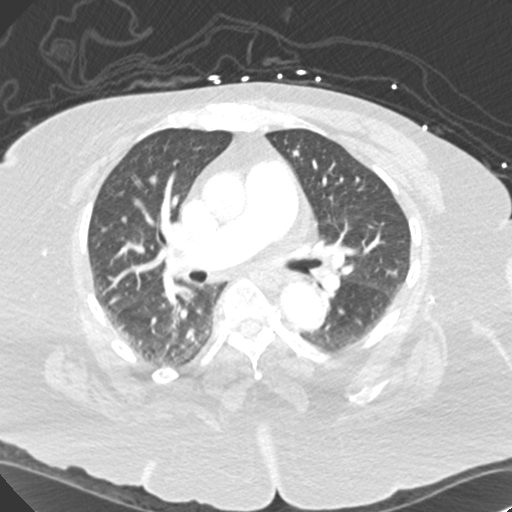
[im 173/277  mediastinal]
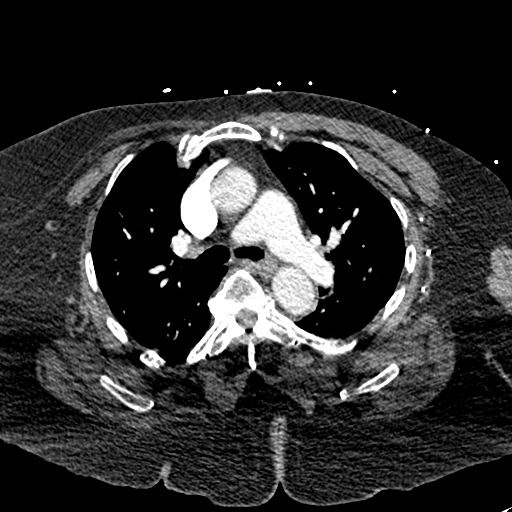
[im 190/277  lung]
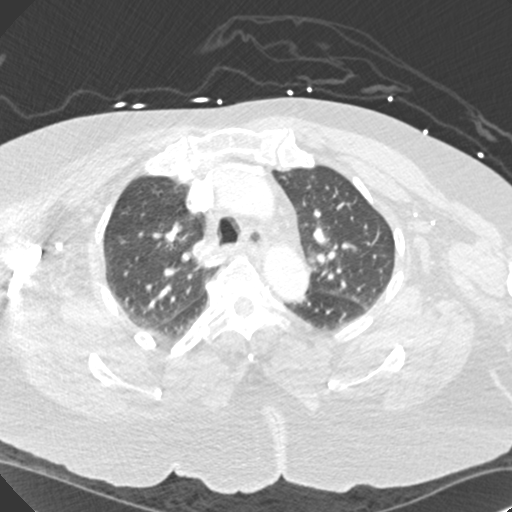
[im 208/277  mediastinal]
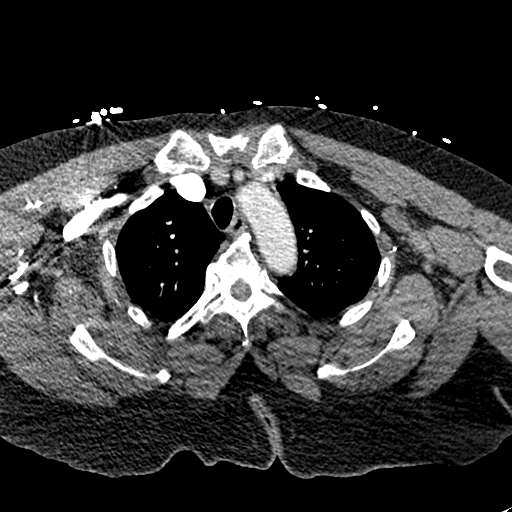
[im 225/277  lung]
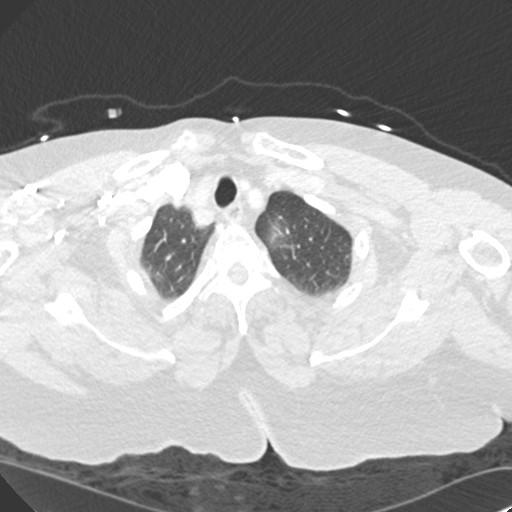
[im 242/277  mediastinal]
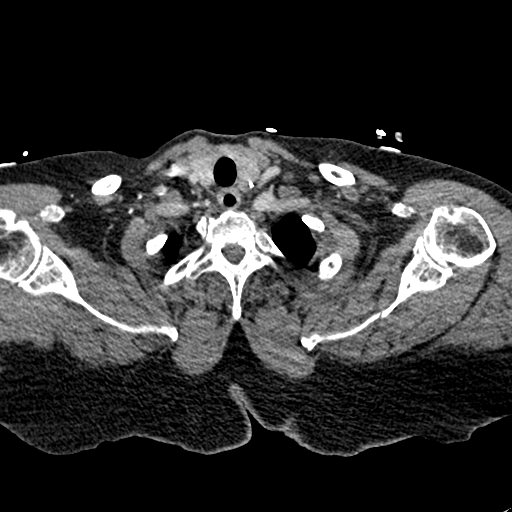
[im 259/277  lung]
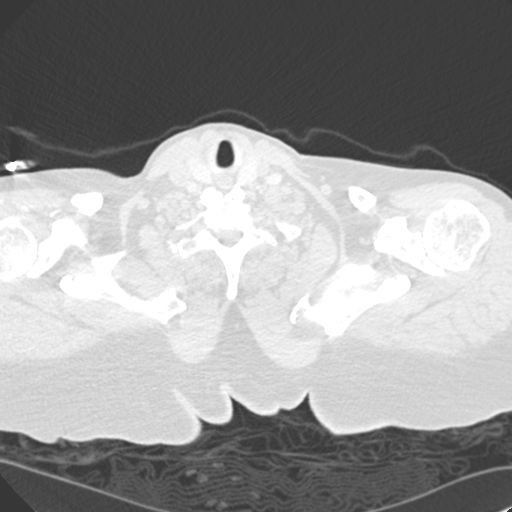

[Series 6: lung · axial · 0.72mm/px · z∈[-71,+55]mm · 3 of 84 slices shown]
[im 21/84  mediastinal]
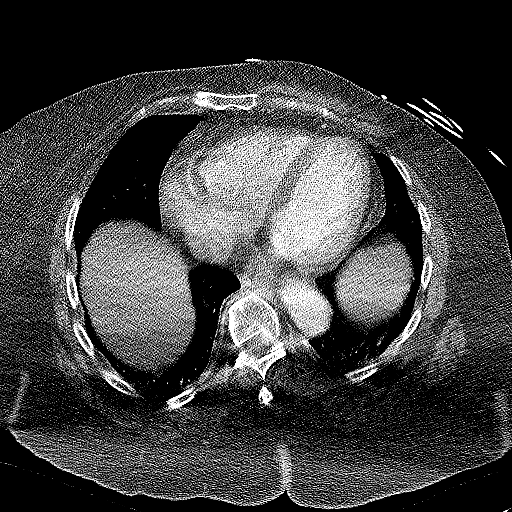
[im 42/84  mediastinal]
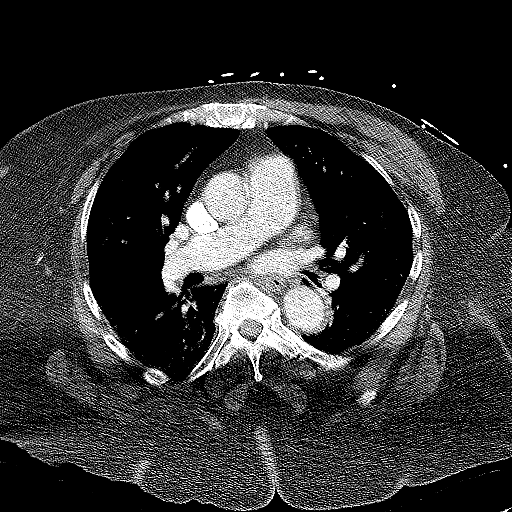
[im 63/84  mediastinal]
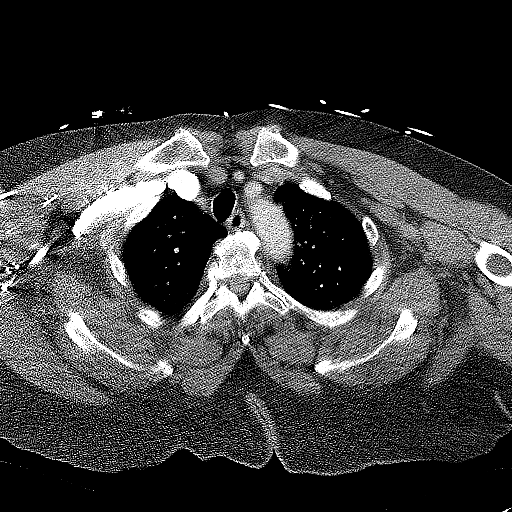

[18 of 36 positions shown; findings below may reference images not displayed]

FINDINGS: Cardiovascular: This is a technically adequate study but respiratory
motion artifact decreases sensitivity within portions of the lungs.

No pulmonary emboli are identified. Cardiomegaly and coronary artery
calcifications noted. There is no evidence of thoracic aortic
aneurysm or pericardial effusion.

Mediastinum/Nodes: No enlarged mediastinal, hilar, or axillary lymph
nodes. Thyroid gland, trachea, and esophagus demonstrate no
significant findings.

Lungs/Pleura: Minimal dependent atelectasis noted. There is no
evidence of airspace disease, consolidation, mass, pleural effusion
or pneumothorax.

Upper Abdomen: Upper right kidney hydronephrosis versus parapelvic
cysts noted.

Musculoskeletal: No acute abnormality or suspicious bony lesions.

Review of the MIP images confirms the above findings.
IMPRESSION: No evidence of pulmonary emboli.

Cardiomegaly and coronary artery disease.

Upper right kidney hydronephrosis versus parapelvic cysts.

Minimal dependent atelectasis.

## 2017-12-05 IMAGING — DX DG CHEST 1V PORT
1 series · 1 of 1 positions shown · non-contrast
Comparison: CT 09/09/2016

CLINICAL DATA: Found unresponsive in parking lot

EXAM:
PORTABLE CHEST 1 VIEW

[chest ap]
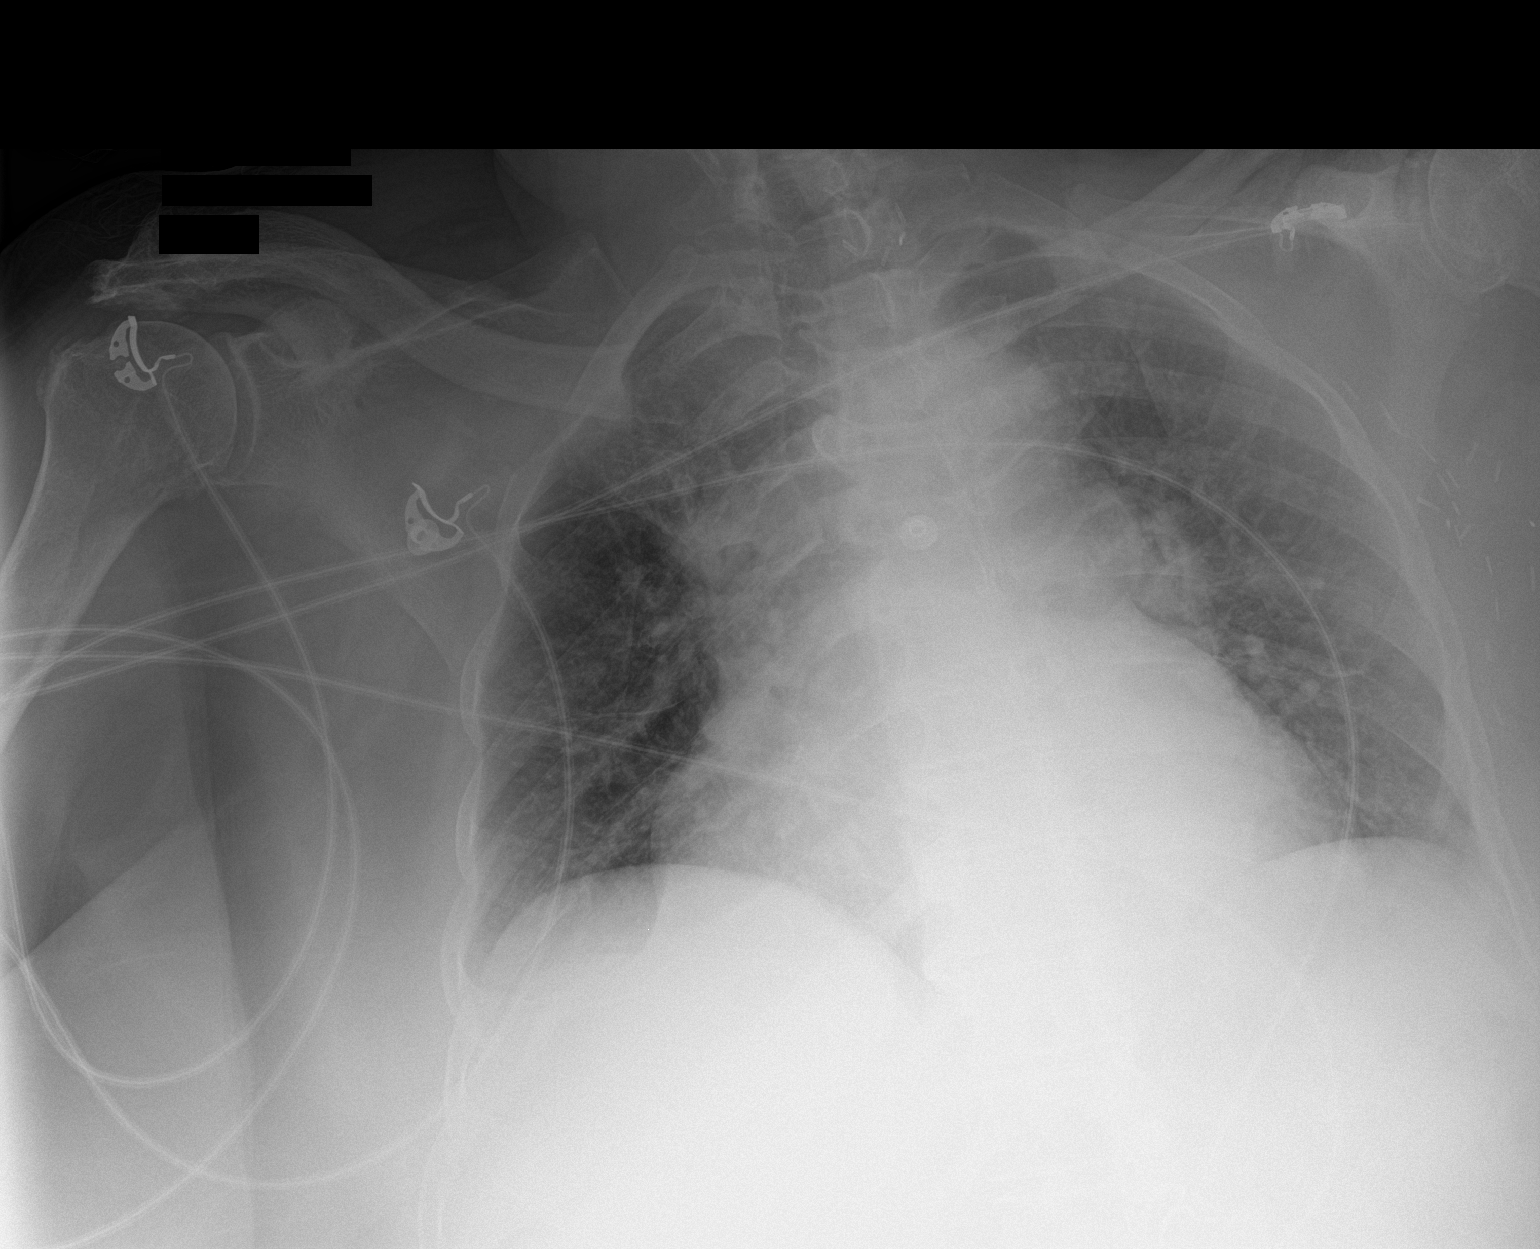

[1 of 1 positions shown; findings below may reference images not displayed]

FINDINGS: Cardiomegaly. Generous mediastinum. Mild central congestion. No
focal consolidation or effusion. No pneumothorax. Surgical clips at
the left thoracic inlet. Left axillary surgical clips.
IMPRESSION: Cardiomegaly with central vascular congestion. No focal infiltrate
is seen.

## 2018-09-02 IMAGING — US US EXTREM LOW VENOUS BILAT
1 series · 13 of 24 positions shown · non-contrast
Comparison: None.

CLINICAL DATA: Bilateral lower extremity swelling



[Series 1: us extrem low venous bilat · 0.09mm/px · 13 of 60 slices shown]
[im 1/60]
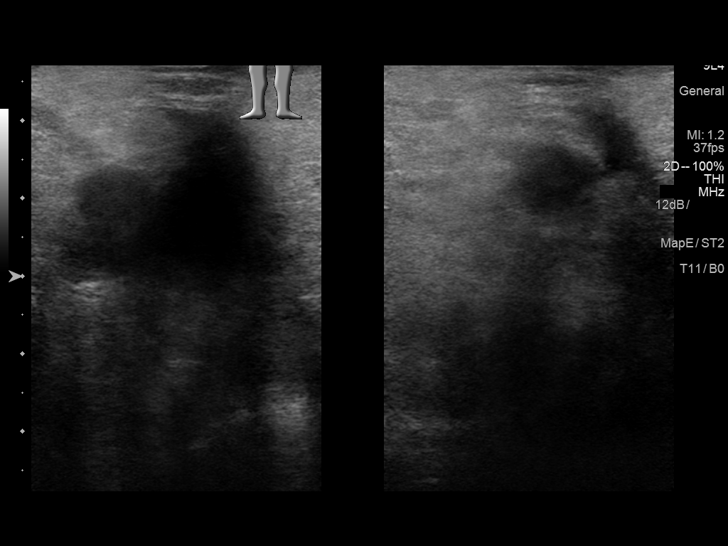
[im 6/60]
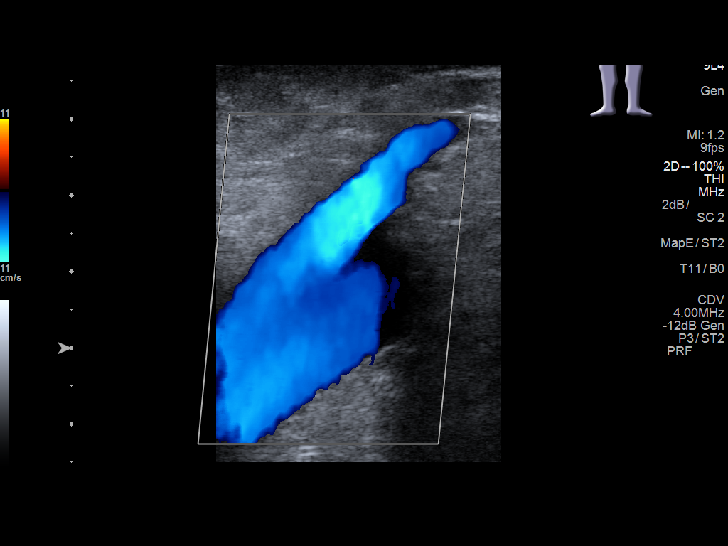
[im 11/60]
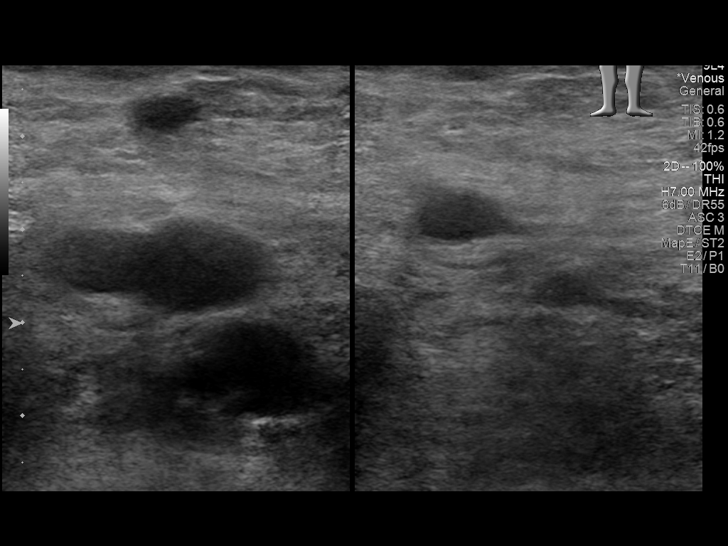
[im 16/60]
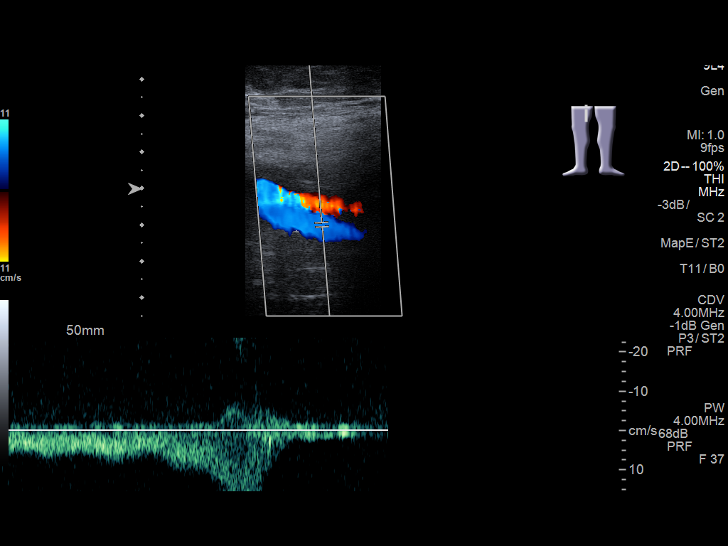
[im 21/60]
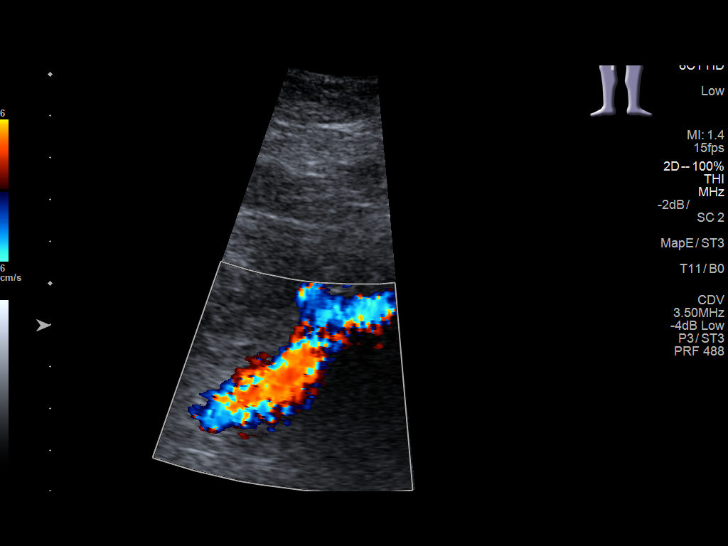
[im 26/60]
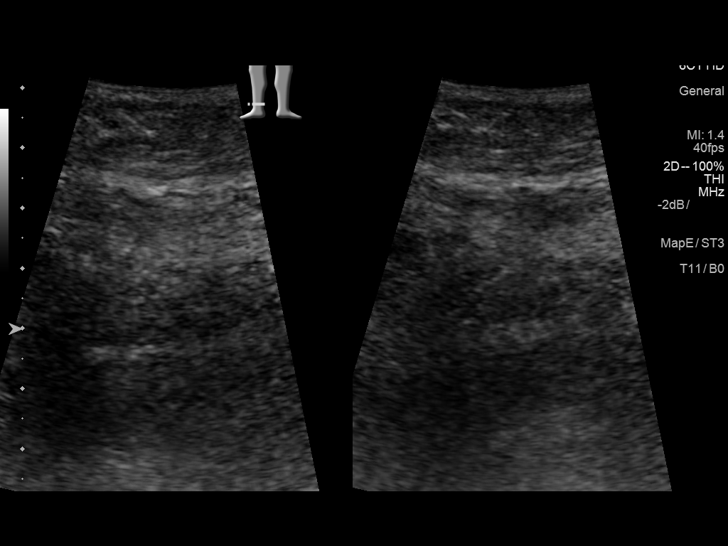
[im 31/60]
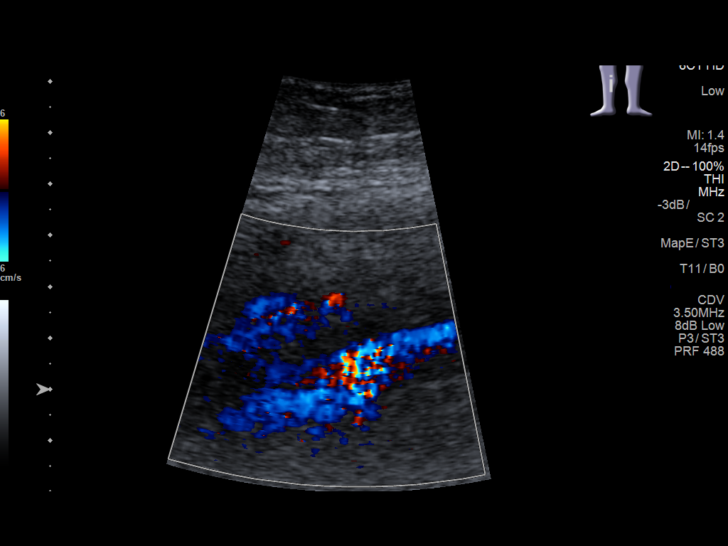
[im 34/60]
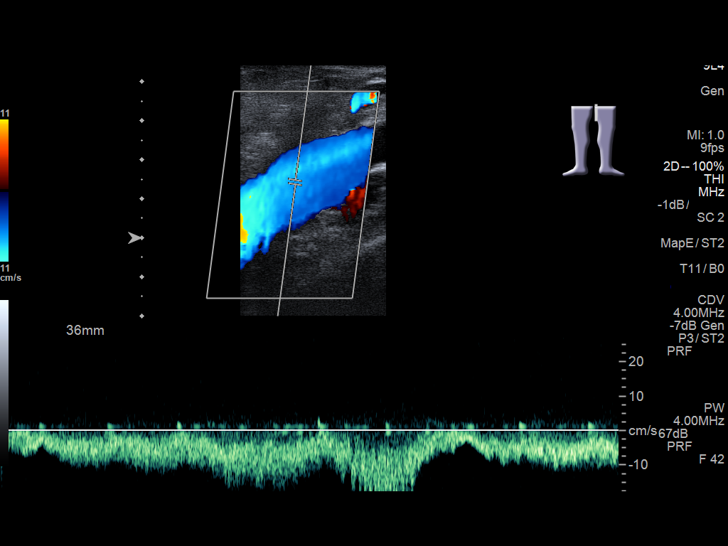
[im 39/60]
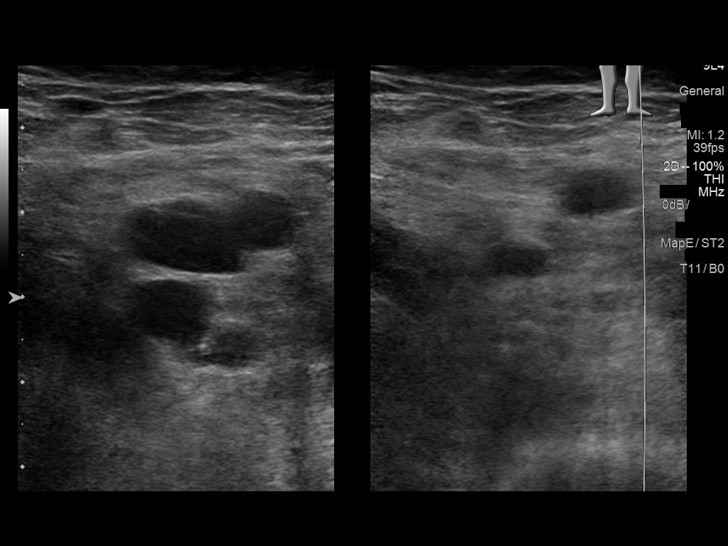
[im 44/60]
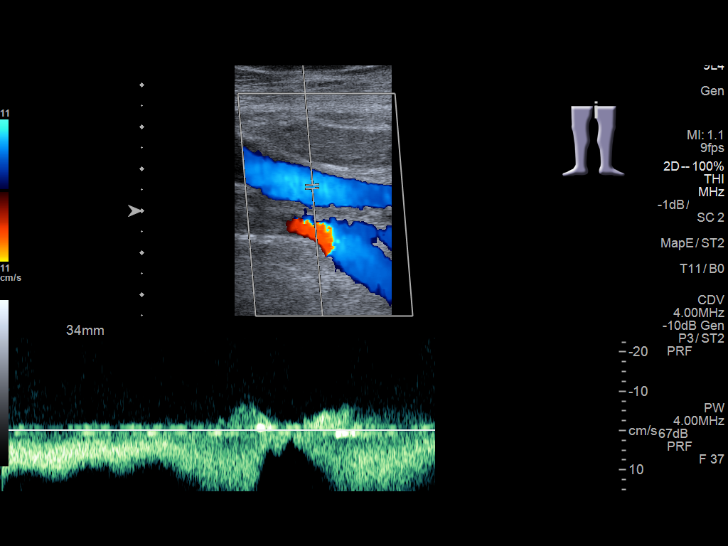
[im 49/60]
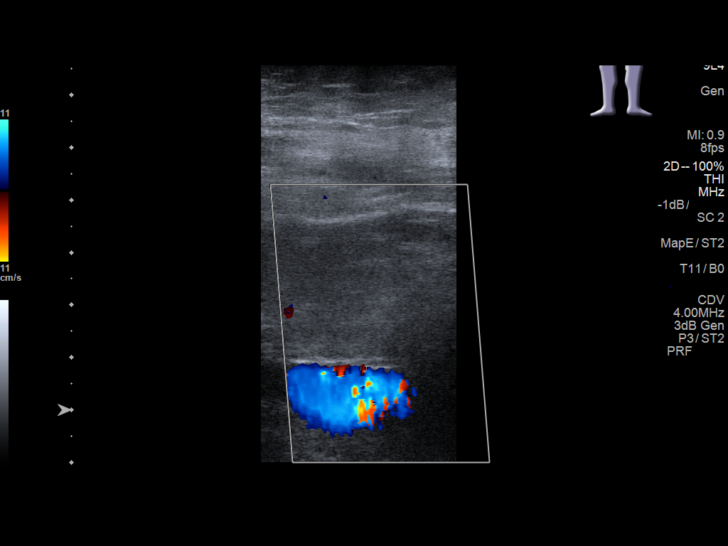
[im 54/60]
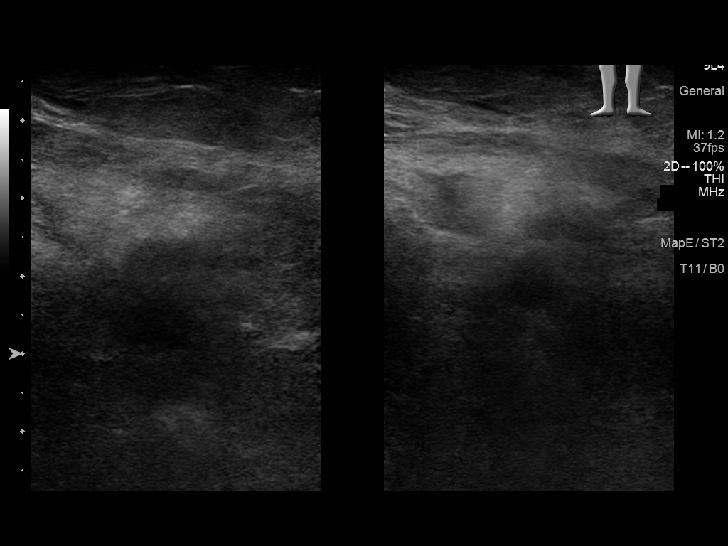
[im 60/60]
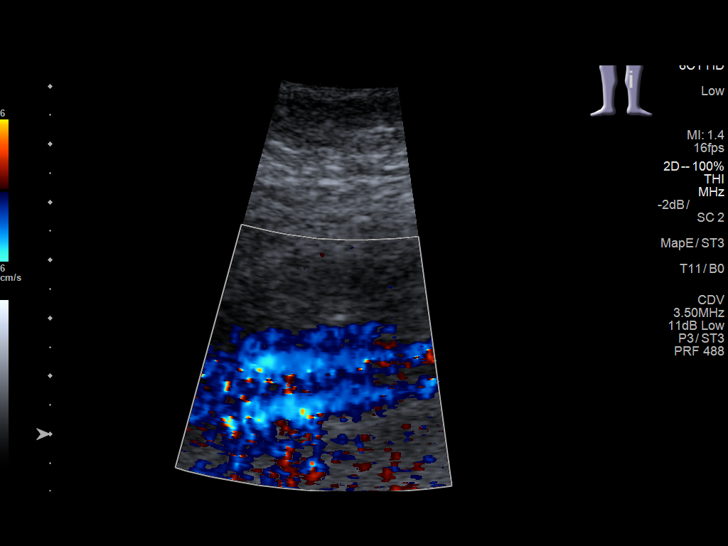

[13 of 24 positions shown; findings below may reference images not displayed]

FINDINGS: RIGHT LOWER EXTREMITY

Common Femoral Vein: No evidence of thrombus. Normal
compressibility, respiratory phasicity and response to augmentation.

Saphenofemoral Junction: No evidence of thrombus. Normal
compressibility and flow on color Doppler imaging.

Profunda Femoral Vein: No evidence of thrombus. Normal
compressibility and flow on color Doppler imaging.

Femoral Vein: No evidence of thrombus. Normal compressibility,
respiratory phasicity and response to augmentation.

Popliteal Vein: No evidence of thrombus. Normal compressibility,
respiratory phasicity and response to augmentation.

Calf Veins: Limited visibility. Suboptimal evaluation due to edema
and the patient's body habitus.

Superficial Great Saphenous Vein: No evidence of thrombus. Normal
compressibility and flow on color Doppler imaging.

Venous Reflux:  None.

Other Findings:  None.

LEFT LOWER EXTREMITY

Common Femoral Vein: No evidence of thrombus. Normal
compressibility, respiratory phasicity and response to augmentation.

Saphenofemoral Junction: No evidence of thrombus. Normal
compressibility and flow on color Doppler imaging.

Profunda Femoral Vein: No evidence of thrombus. Normal
compressibility and flow on color Doppler imaging.

Femoral Vein: No evidence of thrombus. Normal compressibility,
respiratory phasicity and response to augmentation.

Popliteal Vein: No evidence of thrombus. Normal compressibility,
respiratory phasicity and response to augmentation.

Calf Veins: Limited visibility. Suboptimal evaluation due to edema
and the patient's body habitus.

Superficial Great Saphenous Vein: No evidence of thrombus. Normal
compressibility and flow on color Doppler imaging.

Venous Reflux:  None.

Other Findings:  None.
IMPRESSION: No evidence of DVT within the visualized veins of the lower
extremities. The study is limited due to lower extremity swelling
and the patient's body habitus.

## 2022-04-18 DEATH — deceased
